# Patient Record
Sex: Female | Born: 1950 | Race: White | Hispanic: No | Marital: Single | Smoking: Never smoker
Health system: Southern US, Community
[De-identification: ages and names within clinical notes are randomized; demographics above are authoritative.]

## PROBLEM LIST (undated history)

## (undated) DIAGNOSIS — F419 Anxiety disorder, unspecified: Secondary | ICD-10-CM

## (undated) DIAGNOSIS — M353 Polymyalgia rheumatica: Secondary | ICD-10-CM

## (undated) DIAGNOSIS — F329 Major depressive disorder, single episode, unspecified: Secondary | ICD-10-CM

## (undated) DIAGNOSIS — S86019A Strain of unspecified Achilles tendon, initial encounter: Secondary | ICD-10-CM

## (undated) DIAGNOSIS — Z9889 Other specified postprocedural states: Secondary | ICD-10-CM

## (undated) DIAGNOSIS — G4730 Sleep apnea, unspecified: Secondary | ICD-10-CM

## (undated) DIAGNOSIS — E039 Hypothyroidism, unspecified: Secondary | ICD-10-CM

## (undated) DIAGNOSIS — F32A Depression, unspecified: Secondary | ICD-10-CM

## (undated) DIAGNOSIS — R112 Nausea with vomiting, unspecified: Secondary | ICD-10-CM

## (undated) HISTORY — PX: DIAGNOSTIC LAPAROSCOPY: SUR761

## (undated) HISTORY — PX: BREAST BIOPSY: SHX20

---
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
## 2003-03-26 HISTORY — PX: ABDOMINAL HYSTERECTOMY: SHX81

## 2004-05-15 ENCOUNTER — Encounter: Payer: Self-pay | Admitting: Pulmonary Disease

## 2005-03-25 HISTORY — PX: CHOLECYSTECTOMY: SHX55

## 2008-10-12 ENCOUNTER — Encounter: Admission: RE | Admit: 2008-10-12 | Discharge: 2008-10-12 | Payer: Self-pay | Admitting: Chiropractic Medicine

## 2008-10-27 ENCOUNTER — Ambulatory Visit: Payer: Self-pay | Admitting: Pulmonary Disease

## 2008-10-27 DIAGNOSIS — G2581 Restless legs syndrome: Secondary | ICD-10-CM | POA: Insufficient documentation

## 2008-10-27 DIAGNOSIS — G4733 Obstructive sleep apnea (adult) (pediatric): Secondary | ICD-10-CM | POA: Insufficient documentation

## 2009-10-17 ENCOUNTER — Telehealth (INDEPENDENT_AMBULATORY_CARE_PROVIDER_SITE_OTHER): Payer: Self-pay | Admitting: *Deleted

## 2009-11-02 ENCOUNTER — Ambulatory Visit: Payer: Self-pay | Admitting: Pulmonary Disease

## 2010-01-01 ENCOUNTER — Ambulatory Visit: Payer: Self-pay | Admitting: Diagnostic Radiology

## 2010-01-01 ENCOUNTER — Emergency Department (HOSPITAL_BASED_OUTPATIENT_CLINIC_OR_DEPARTMENT_OTHER): Admission: EM | Admit: 2010-01-01 | Discharge: 2010-01-01 | Payer: Self-pay | Admitting: Emergency Medicine

## 2010-03-30 ENCOUNTER — Encounter: Payer: Self-pay | Admitting: Pulmonary Disease

## 2010-04-24 NOTE — Progress Notes (Signed)
Summary: talk to nurse  re: requip  Phone Note Call from Patient Call back at Home Phone [phone]   Caller: Patient Call For: clance Reason for Call: Talk to Nurse Summary of Call: Pt states she is currently taking requip 1mg  and it works 50% of the time, wants to know if there is a higher dosage of this med, pls advise.//[facility] [address] Initial call taken by: Darletta Moll,  October 17, 2009 8:21 AM  Follow-up for Phone Call        called and spoke with pt.  pt was last seen by [facility] 10-27-2008.  Pt states she is currently taking Requip 1mg  after supper.  Pt states this dose helps with her RLS approx 50% of the time.  The other times, pt states she is very restless and cannot get her "legs to calm down."  Pt wonders if her dosage needs to be increased.  Will forward message to [facility] to address.  Aundra Millet Reynolds LPN  October 17, 2009 9:06 AM  FYI:  Jonathon Bellows, since it has been almost a year since you last saw her- I'll go ahead and schedule her for a yearly f/u appt with you as well.  Additional Follow-up for Phone Call Additional follow up Details #1::        she does need upcoming ov in the meantime, can increase requip to 1.5mg  after dinner.   can call in 0.5mg  tabs to take with the 1mg  tabs.  #20, no fills until we can figure out if will help.  If it doesn't may have to check iron levels or try different med. Additional Follow-up by: Barbaraann Share MD,  October 17, 2009 1:06 PM    Additional Follow-up for Phone Call Additional follow up Details #2::    called and spoke with pt.  pt aware of KC's recs.  Pt scheduled f/u appt with [facility] for 11-02-2009 at 3:45pm.   Pt aware rx for 0.5 mg sent to pharmacy.  pt also states she is almost out of the 1mg  and requests that be called in as well.  rx sent to pharmacy.  Aundra Millet Reynolds LPN  October 17, 2009 1:20 PM   New/Updated Medications: REQUIP 0.5 MG TABS (ROPINIROLE HCL) take 1 tab by mouth once daily after dinner along with a 1mg  tab as well. Prescriptions: REQUIP  1 MG  TABS (ROPINIROLE HCL) one after dinner.  #20 x 0   Entered by:   Arman Filter LPN   Authorized by:   Barbaraann Share MD   Signed by:   Arman Filter LPN on 09/81/1914   Method used:   Elect[facility] to        [phone] (retail[facility]       [address]       [place], [place]  [address]       Ph: [phone] or [phone]       Fax: [phone]   RxID:   [id number] REQUIP 0.5 MG TABS (ROPINIROLE HCL) take 1 tab by mouth once daily after dinner along with a 1mg  tab as well.  #20 x 0   Entered by:   Arman Filter LPN   Authorized by:   Barbaraann Share MD   Signed by:   Arman Filter LPN on 56/43/3295   Method used:   Electronically to        [phone][facility] (retail)       [address]  [place], [place]  [address]       Ph: [phone] or [phone]       Fax: [phone]   RxID:   [id number]

## 2010-04-24 NOTE — Assessment & Plan Note (Signed)
Summary: rov for osa, rls   CC:  Pt is here for a 1 yr f/u appt on her OSA and RLS.  Pt states she hasn't noticed a difference in her RLS since increasing dose of Requip to 1.5mg .  Pt states she is wearing her cpap machine every night.  Approx 6 to 7 hours per night. Pt states she is waking up 2 times every night and is unsure why this is happening.  Marland Kitchen  History of Present Illness: the pt comes in today for f/u of her known osa and RLS.  She is wearing cpap compliantly, and reports no issues with her mask or pressure.  Despite this, the pt has been having awakenings during the night for unknown reasons.  She has a h/o RLS, and is having some breakthru symptoms despite increasing her dose to 1.5mg  at hs.  Current Medications (verified): 1)  Prozac 20 Mg Caps (Fluoxetine Hcl) .... Once Daily 2)  Synthroid 50 Mcg Tabs (Levothyroxine Sodium) .... Once Daily 3)  Prednisone 5 Mg Tabs (Prednisone) .... Take 2 Tabs By Mouth Daily 4)  Requip 1 Mg  Tabs (Ropinirole Hcl) .... One After Dinner. 5)  Requip 0.5 Mg Tabs (Ropinirole Hcl) .... Take 1 Tab By Mouth Once Daily After Dinner Along With A 1mg  Tab As Well. 6)  Methotrexate 20mg  .... Weekly 7)  Folic Acid 1 Mg Tabs (Folic Acid) .... Take 1 Tablet By Mouth Two Times A Day 8)  Hydrochlorothiazide 25 Mg Tabs (Hydrochlorothiazide) .... Take 1 Tablet By Mouth Once A Day 9)  Vitamin D 2000 Units .... Take 1 Tablet By Mouth Once A Day 10)  Calcium 1200mg  .... Take 1 Tablet By Mouth Once A Day  Allergies (verified): 1)  ! Pcn 2)  ! Codeine  Review of Systems       The patient complains of productive cough, acid heartburn, indigestion, weight change, and hand/feet swelling.  The patient denies shortness of breath with activity, shortness of breath at rest, non-productive cough, coughing up blood, chest pain, irregular heartbeats, loss of appetite, abdominal pain, difficulty swallowing, sore throat, tooth/dental problems, headaches, nasal  congestion/difficulty breathing through nose, sneezing, itching, ear ache, anxiety, depression, joint stiffness or pain, rash, change in color of mucus, and fever.    Vital Signs:  Patient profile:   60 year old female Height:      66 inches Weight:      254.38 pounds BMI:     41.21 O2 Sat:      95 % on Room air Temp:     98.2 degrees F oral Pulse rate:   81 / minute BP sitting:   102 / 76  (left arm) Cuff size:   large  Vitals Entered By: Arman Filter LPN (November 02, 2009 4:02 PM)  O2 Flow:  Room air CC: Pt is here for a 1 yr f/u appt on her OSA and RLS.  Pt states she hasn't noticed a difference in her RLS since increasing dose of Requip to 1.5mg .  Pt states she is wearing her cpap machine every night.  Approx 6 to 7 hours per night. Pt states she is waking up 2 times every night and is unsure why this is happening.   Comments Medications reviewed with patient Arman Filter LPN  November 02, 2009 4:03 PM    Physical Exam  General:  obese female in nad Nose:  no skin breakdown or pressure necrosis from cpap mask Extremities:  mild edema, but no cyanosis  Neurologic:  alert, not sleepy, moves all 4.   Impression & Recommendations:  Problem # 1:  OBSTRUCTIVE SLEEP APNEA (ICD-327.23)  the pt seems to be doing well with cpap.  I have asked her to keep up with mask changes and supplies, and to work aggressively on weight loss.  Problem # 2:  RESTLESS LEGS SYNDROME (ICD-333.94)  the pt is having some breakthru symptoms despite taking requip.  She is taking all of the 1.5mg  dose after dinner, and perhaps this doesn't get her thru the night.  I have asked her to try splitting her dose, with the majority being taken at bedtime.  I also reminded her that chronic pain can mimick RLS symptoms, and that other environmental factors ( light, temp, mattress/pillow, bedpartner, pets) can result in arousal from sleep.  Medications Added to Medication List This Visit: 1)  Prednisone 5 Mg Tabs  (Prednisone) .... Take 2 tabs by mouth daily 2)  Methotrexate 20mg   .... Weekly 3)  Folic Acid 1 Mg Tabs (Folic acid) .... Take 1 tablet by mouth two times a day 4)  Hydrochlorothiazide 25 Mg Tabs (Hydrochlorothiazide) .... Take 1 tablet by mouth once a day 5)  Vitamin D 2000 Units  .... Take 1 tablet by mouth once a day 6)  Calcium 1200mg   .... Take 1 tablet by mouth once a day  Other Orders: Est. Patient Level III ([address])  Patient Instructions: 1)  continue on cpap  2)  work on weight loss and exercise 3)  try taking 0.5mg  of requip at dinner time, then the 1mg  about 30-62min before going to bed.  Let me know if not helping.  Keep in mind that chronic pain can also mimick RLS. 4)  followup with me in one year.

## 2010-04-26 NOTE — Medication Information (Signed)
Summary: Order for CPAP Supplies / [employer] for [employer] [facility] [employer]   Imported By: Lennie Odor 04/10/2010 11:28:03  _____________________________________________________________________  External Attachment:    Type:   Image     Comment:   External Document

## 2010-06-07 LAB — DIFFERENTIAL
Basophils Absolute: 0.1 10*3/uL (ref 0.0–0.1)
Basophils Relative: 1 % (ref 0–1)
Eosinophils Absolute: 0.1 10*3/uL (ref 0.0–0.7)
Eosinophils Relative: 1 % (ref 0–5)
Lymphocytes Relative: 21 % (ref 12–46)
Lymphs Abs: 1.4 10*3/uL (ref 0.7–4.0)
Monocytes Absolute: 0.5 10*3/uL (ref 0.1–1.0)
Monocytes Relative: 7 % (ref 3–12)
Neutro Abs: 4.6 10*3/uL (ref 1.7–7.7)
Neutrophils Relative %: 70 % (ref 43–77)

## 2010-06-07 LAB — BASIC METABOLIC PANEL
BUN: 13 mg/dL (ref 6–23)
CO2: 28 mEq/L (ref 19–32)
Calcium: 8.6 mg/dL (ref 8.4–10.5)
Chloride: 106 mEq/L (ref 96–112)
Creatinine, Ser: 0.8 mg/dL (ref 0.4–1.2)
GFR calc Af Amer: >60 mL/min (ref >60)
GFR calc non Af Amer: >60 mL/min (ref >60)
Glucose, Bld: 90 mg/dL (ref 70–99)
Potassium: 3.2 mEq/L — ABNORMAL LOW (ref 3.5–5.1)
Sodium: 140 mEq/L (ref 135–145)

## 2010-06-07 LAB — URINE CULTURE
Colony Count: 15000
Culture  Setup Time: 201110101931

## 2010-06-07 LAB — URINALYSIS, ROUTINE W REFLEX MICROSCOPIC
Bilirubin Urine: NEGATIVE
Glucose, UA: NEGATIVE mg/dL
Hgb urine dipstick: NEGATIVE
Ketones, ur: NEGATIVE mg/dL
Nitrite: NEGATIVE
Protein, ur: NEGATIVE mg/dL
Specific Gravity, Urine: 1.008 (ref 1.005–1.030)
Urobilinogen, UA: 1 mg/dL (ref 0.0–1.0)
pH: 7.5 (ref 5.0–8.0)

## 2010-06-07 LAB — CBC
HCT: 34.9 % — ABNORMAL LOW (ref 36.0–46.0)
Hemoglobin: 11.8 g/dL — ABNORMAL LOW (ref 12.0–15.0)
MCH: 30 pg (ref 26.0–34.0)
MCHC: 33.9 g/dL (ref 30.0–36.0)
MCV: 88.6 fL (ref 78.0–100.0)
Platelets: 228 10*3/uL (ref 150–400)
RBC: 3.94 MIL/uL (ref 3.87–5.11)
RDW: 14 % (ref 11.5–15.5)
WBC: 6.7 10*3/uL (ref 4.0–10.5)

## 2010-06-07 LAB — POCT CARDIAC MARKERS
CKMB, poc: <1 ng/mL — ABNORMAL LOW (ref 1.0–8.0)
Myoglobin, poc: 28.8 ng/mL (ref 12–200)
Troponin i, poc: <0.05 ng/mL (ref 0.00–0.09)

## 2010-07-16 ENCOUNTER — Other Ambulatory Visit: Payer: Self-pay | Admitting: Pulmonary Disease

## 2010-08-01 ENCOUNTER — Ambulatory Visit (HOSPITAL_COMMUNITY)
Admission: RE | Admit: 2010-08-01 | Discharge: 2010-08-01 | Disposition: A | Discharge status: Home / Self Care | Payer: BC Managed Care – PPO | Source: Ambulatory Visit | Attending: Interventional Radiology | Admitting: Interventional Radiology

## 2010-08-01 DIAGNOSIS — M79609 Pain in unspecified limb: Secondary | ICD-10-CM | POA: Insufficient documentation

## 2010-08-01 DIAGNOSIS — M7989 Other specified soft tissue disorders: Secondary | ICD-10-CM | POA: Insufficient documentation

## 2010-11-27 ENCOUNTER — Other Ambulatory Visit: Payer: Self-pay | Admitting: Pulmonary Disease

## 2012-05-11 ENCOUNTER — Other Ambulatory Visit: Payer: Self-pay | Admitting: Otolaryngology

## 2012-05-11 DIAGNOSIS — R600 Localized edema: Secondary | ICD-10-CM

## 2012-05-11 DIAGNOSIS — R221 Localized swelling, mass and lump, neck: Secondary | ICD-10-CM

## 2012-05-11 DIAGNOSIS — K1120 Sialoadenitis, unspecified: Secondary | ICD-10-CM

## 2012-05-12 ENCOUNTER — Ambulatory Visit
Admission: RE | Admit: 2012-05-12 | Discharge: 2012-05-12 | Disposition: A | Discharge status: Home / Self Care | Payer: 59 | Source: Ambulatory Visit | Attending: Otolaryngology | Admitting: Otolaryngology

## 2012-05-12 DIAGNOSIS — R600 Localized edema: Secondary | ICD-10-CM

## 2012-05-12 DIAGNOSIS — R221 Localized swelling, mass and lump, neck: Secondary | ICD-10-CM

## 2012-05-12 DIAGNOSIS — K1120 Sialoadenitis, unspecified: Secondary | ICD-10-CM

## 2012-05-12 MED ORDER — IOHEXOL 300 MG/ML  SOLN
75.0000 mL | Freq: Once | INTRAMUSCULAR | Status: AC | PRN
  Administered 2012-05-12: 75 mL via INTRAVENOUS

## 2012-07-01 ENCOUNTER — Encounter (HOSPITAL_COMMUNITY): Payer: Self-pay | Admitting: *Deleted

## 2012-07-06 ENCOUNTER — Ambulatory Visit (HOSPITAL_COMMUNITY): Admission: RE | Admit: 2012-07-06 | Payer: 59 | Source: Ambulatory Visit | Admitting: Obstetrics & Gynecology

## 2012-07-06 HISTORY — DX: Other specified postprocedural states: R11.2

## 2012-07-06 HISTORY — DX: Other specified postprocedural states: Z98.890

## 2012-07-06 HISTORY — DX: Sleep apnea, unspecified: G47.30

## 2012-07-06 HISTORY — DX: Hypothyroidism, unspecified: E03.9

## 2012-07-06 SURGERY — MARSUPIALIZATION, CYST, BARTHOLIN'S GLAND
Anesthesia: Choice | Laterality: Right

## 2012-07-29 DIAGNOSIS — K1120 Sialoadenitis, unspecified: Secondary | ICD-10-CM | POA: Insufficient documentation

## 2014-05-17 ENCOUNTER — Encounter: Payer: Self-pay | Admitting: Podiatry

## 2014-05-17 ENCOUNTER — Ambulatory Visit (INDEPENDENT_AMBULATORY_CARE_PROVIDER_SITE_OTHER): Payer: BLUE CROSS/BLUE SHIELD

## 2014-05-17 ENCOUNTER — Ambulatory Visit (INDEPENDENT_AMBULATORY_CARE_PROVIDER_SITE_OTHER): Payer: BLUE CROSS/BLUE SHIELD | Admitting: Podiatry

## 2014-05-17 ENCOUNTER — Encounter: Payer: Self-pay | Admitting: *Deleted

## 2014-05-17 VITALS — BP 130/74 | HR 84 | Resp 16 | Ht 66.0 in | Wt 260.0 lb

## 2014-05-17 DIAGNOSIS — E039 Hypothyroidism, unspecified: Secondary | ICD-10-CM | POA: Insufficient documentation

## 2014-05-17 DIAGNOSIS — M1990 Unspecified osteoarthritis, unspecified site: Secondary | ICD-10-CM | POA: Insufficient documentation

## 2014-05-17 DIAGNOSIS — M353 Polymyalgia rheumatica: Secondary | ICD-10-CM | POA: Insufficient documentation

## 2014-05-17 DIAGNOSIS — M779 Enthesopathy, unspecified: Secondary | ICD-10-CM

## 2014-05-17 DIAGNOSIS — M722 Plantar fascial fibromatosis: Secondary | ICD-10-CM

## 2014-05-17 DIAGNOSIS — G2581 Restless legs syndrome: Secondary | ICD-10-CM | POA: Insufficient documentation

## 2014-05-17 DIAGNOSIS — M7662 Achilles tendinitis, left leg: Secondary | ICD-10-CM

## 2014-05-17 MED ORDER — METHYLPREDNISOLONE (PAK) 4 MG PO TABS: ORAL_TABLET | ORAL | Status: DC

## 2014-05-17 NOTE — Progress Notes (Signed)
   Subjective:    Patient ID: Kathy Fernandez, female    DOB: Jul 02, 1950, 64 y.o.   MRN: [id number]  HPI Comments: Left back of the heel has started to hurt about 6 weeks ago , it has got worse. It is swelling and pain along the lateral side of ankle   Foot Pain Associated symptoms include coughing and numbness.      Review of Systems  Respiratory: Positive for cough.   Musculoskeletal: Positive for back pain.  Neurological: Positive for numbness.  All other systems reviewed and are negative.      Objective:   Physical Exam: I have reviewed her past medical history medications allergies surgery social history and review of systems. Pulses are strongly palpable bilateral. Neurologic sensorium is intact per Semmes-Weinstein monofilament. Deep tendon reflexes are intact bilateral and muscle strength was 5 over 5 dorsiflexors plantar flexors and inverters and everters all intrinsic musculature is intact. Orthopedic evaluation demonstrates all joints distal to the ankle for range of motion without crepitation. She has pain on palpation of the Achilles tendon on the posterior aspect of the left heel with warmth edema and erythema to the touch. Majority of the pain is centrally located in the posterior aspect of the calcaneus. The right posterior heel and Achilles also demonstrates what appears to have been a previous tear with thickening of the tendo Achilles that is nontender at this time. Radiographic evaluation of the left foot does demonstrate a very large retrocalcaneal heel spur with thickening of the tendo Achilles as it inserts.        Assessment & Plan:  Assessment: Insertional Achilles tendinitis left with retrocalcaneal heel spur. Can't rule out a split tear.  Plan: We discussed the etiology pathology conservative versus surgical therapies. We discussed appropriate shoe gear stretching exercises ice therapy and shoe gear modifications. We wrote a prescription for Medrol Dosepak to be  followed by her naproxen. I also injected dexamethasone 2 mg to the point of maximal tenderness subcutaneously and not into the tendon. Also placed her in a Cam Walker for immobilization. I will follow-up with her in 3-4 weeks at which time if she is no better and MRI will be ordered. If she has improved then physical therapy will be ordered.  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

## 2014-05-24 ENCOUNTER — Other Ambulatory Visit: Payer: Self-pay

## 2014-05-24 DIAGNOSIS — Z1231 Encounter for screening mammogram for malignant neoplasm of breast: Secondary | ICD-10-CM

## 2014-06-28 ENCOUNTER — Ambulatory Visit: Payer: BLUE CROSS/BLUE SHIELD | Admitting: Podiatry

## 2014-06-29 ENCOUNTER — Ambulatory Visit: Payer: Self-pay

## 2014-07-05 ENCOUNTER — Ambulatory Visit
Admission: RE | Admit: 2014-07-05 | Discharge: 2014-07-05 | Disposition: A | Discharge status: Home / Self Care | Payer: BLUE CROSS/BLUE SHIELD | Source: Ambulatory Visit

## 2014-07-05 DIAGNOSIS — Z1231 Encounter for screening mammogram for malignant neoplasm of breast: Secondary | ICD-10-CM

## 2014-07-07 ENCOUNTER — Encounter: Payer: Self-pay | Admitting: Podiatry

## 2014-07-07 ENCOUNTER — Ambulatory Visit (INDEPENDENT_AMBULATORY_CARE_PROVIDER_SITE_OTHER): Payer: BLUE CROSS/BLUE SHIELD | Admitting: Podiatry

## 2014-07-07 VITALS — BP 116/62 | HR 71 | Resp 16

## 2014-07-07 DIAGNOSIS — M7662 Achilles tendinitis, left leg: Secondary | ICD-10-CM | POA: Diagnosis not present

## 2014-07-07 DIAGNOSIS — S86012D Strain of left Achilles tendon, subsequent encounter: Secondary | ICD-10-CM | POA: Diagnosis not present

## 2014-07-07 NOTE — Progress Notes (Signed)
She presents today for follow-up of her Achilles tendinitis of long duration left heel. She has been diligent about wearing the Cam Walker however at this point she states that it just does not seem to be improving.  Objective: Vital signs are stable she's alert and oriented 3 no calf pain. Mild swelling in the left lower extremity pulses are palpable left. She has tenderness on palpation of the Achilles tendon left.  Assessment: Probable split tear of the tendo Achilles with a retrocalcaneal heel spur present.  Plan: At this point I feel an MRI is necessary to evaluate the integrity of the tendon for surgical consideration. I will follow up with her once her MRI has been read.

## 2014-07-08 ENCOUNTER — Telehealth: Payer: Self-pay | Admitting: *Deleted

## 2014-07-08 NOTE — Telephone Encounter (Signed)
Pt is scheduled for MRI on 07/12/2014 and needs prior authorization.

## 2014-07-08 NOTE — Telephone Encounter (Signed)
Pt is schedule for MRI on 07/12/2014, needs prior authorization.

## 2014-07-11 NOTE — Telephone Encounter (Signed)
I called and left Kathy Fernandez a message that MRI was authorized.  Authorization number is [id number], approved from 07/11/2014 - 08/09/2014.  I called patient and left a message that MRI was authorized.  I left a message for Kathy Fernandez at [facility].  "I've been waiting for 10 minutes to speak to you.  I've talked to 3 different people and was told they couldn't reach you."  I just called and left you a message that MRI was authorized.  "When did you call me?"  Probably while they were trying to contact me for you.  "Well I didn't know, thank you."

## 2014-07-12 ENCOUNTER — Ambulatory Visit
Admission: RE | Admit: 2014-07-12 | Discharge: 2014-07-12 | Disposition: A | Discharge status: Home / Self Care | Payer: BLUE CROSS/BLUE SHIELD | Source: Ambulatory Visit | Attending: Podiatry | Admitting: Podiatry

## 2014-07-12 DIAGNOSIS — M7662 Achilles tendinitis, left leg: Secondary | ICD-10-CM

## 2014-07-14 ENCOUNTER — Telehealth: Payer: Self-pay | Admitting: *Deleted

## 2014-07-14 NOTE — Telephone Encounter (Signed)
Achilles tendinosis with out tear but increased fluid signal within the tendon.

## 2014-07-14 NOTE — Telephone Encounter (Addendum)
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  I left a message to call again for results.  I called pt and read Dr. Geryl RankinsHyatt's statement to pt.

## 2014-07-14 NOTE — Telephone Encounter (Signed)
I called and left patient a message that Dr. Al CorpusHyatt got results.  He wants to have it re-read by another physician.  We wanted to make you aware of delay.  Please call if you have any questions or concerns.  MRI disk was [facility] to [facility] to be re-read by Dr. Stephani Policeallas Smith.

## 2014-07-20 ENCOUNTER — Encounter: Payer: Self-pay | Admitting: Podiatry

## 2014-07-21 ENCOUNTER — Ambulatory Visit: Payer: BLUE CROSS/BLUE SHIELD | Admitting: Podiatry

## 2014-07-26 ENCOUNTER — Encounter: Payer: Self-pay | Admitting: Podiatry

## 2014-07-26 ENCOUNTER — Ambulatory Visit (INDEPENDENT_AMBULATORY_CARE_PROVIDER_SITE_OTHER): Payer: BLUE CROSS/BLUE SHIELD | Admitting: Podiatry

## 2014-07-26 VITALS — BP 129/72 | HR 71 | Resp 16

## 2014-07-26 DIAGNOSIS — S86012D Strain of left Achilles tendon, subsequent encounter: Secondary | ICD-10-CM | POA: Diagnosis not present

## 2014-07-26 DIAGNOSIS — M7732 Calcaneal spur, left foot: Secondary | ICD-10-CM | POA: Diagnosis not present

## 2014-07-26 NOTE — Patient Instructions (Signed)
Pre-Operative Instructions  Congratulations, you have decided to take an important step to improving your quality of life.  You can be assured that the doctors of [facility] will be with you every step of the way.  1. Plan to be at the surgery center/hospital at least 1 (one) hour prior to your scheduled time unless otherwise directed by the surgical center/hospital staff.  You must have a responsible adult accompany you, remain during the surgery and drive you home.  Make sure you have directions to the surgical center/hospital and know how to get there on time. 2. For hospital based surgery you will need to obtain a history and physical form from your family physician within 1 month prior to the date of surgery- we will give you a form for you primary physician.  3. We make every effort to accommodate the date you request for surgery.  There are however, times where surgery dates or times have to be moved.  We will contact you as soon as possible if a change in schedule is required.   4. No Aspirin/Ibuprofen for one week before surgery.  If you are on aspirin, any non-steroidal anti-inflammatory medications (Mobic, Aleve, Ibuprofen) you should stop taking it 7 days prior to your surgery.  You make take Tylenol  For pain prior to surgery.  5. Medications- If you are taking daily heart and blood pressure medications, seizure, reflux, allergy, asthma, anxiety, pain or diabetes medications, make sure the surgery center/hospital is aware before the day of surgery so they may notify you which medications to take or avoid the day of surgery. 6. No food or drink after midnight the night before surgery unless directed otherwise by surgical center/hospital staff. 7. No alcoholic beverages 24 hours prior to surgery.  No smoking 24 hours prior to or 24 hours after surgery. 8. Wear loose pants or shorts- loose enough to fit over bandages, boots, and casts. 9. No slip on shoes, sneakers are best. 10. Bring  your boot with you to the surgery center/hospital.  Also bring crutches or a walker if your physician has prescribed it for you.  If you do not have this equipment, it will be provided for you after surgery. 11. If you have not been contracted by the surgery center/hospital by the day before your surgery, call to confirm the date and time of your surgery. 12. Leave-time from work may vary depending on the type of surgery you have.  Appropriate arrangements should be made prior to surgery with your employer. 13. Prescriptions will be provided immediately following surgery by your doctor.  Have these filled as soon as possible after surgery and take the medication as directed. 14. Remove nail polish on the operative foot. 15. Wash the night before surgery.  The night before surgery wash the foot and leg well with the antibacterial soap provided and water paying special attention to beneath the toenails and in between the toes.  Rinse thoroughly with water and dry well with a towel.  Perform this wash unless told not to do so by your physician.  Enclosed: 1 Ice pack (please put in freezer the night before surgery)   1 Hibiclens skin cleaner   Pre-op Instructions  If you have any questions regarding the instructions, do not hesitate to call our office.  [place]: [address] [place], [place] [address] [phone]  [place]: [address] [place], [place] [address] [phone]  [place]: [address]  [place], [place] [address] [phone]  Dr. Richard   Tuchman DPM, Dr. Norman Regal DPM Dr. Richard Sikora DPM, Dr. M. Todd Hyatt DPM, Dr. Kathryn Egerton DPM 

## 2014-07-26 NOTE — Progress Notes (Signed)
She presents today for her MRI results. She states that her left Achilles is still painful as it was and has not improved even wearing the Cam Walker.  Objective: Vital signs are stable she is alert and oriented 3. Pulses are palpable left. No swelling in the calf. She does not have gastroc equinus. She has pain on palpation of the tendo Achilles as it inserts on the posterior aspect of the calcaneus. Mild erythema and mild edema are associated with this. Radiographic evaluation was reviewed does demonstrate a retrocalcaneal heel spur. MRI evaluation reviewed does demonstrate a soft tissue sagittal split tear of the tendo Achilles at its insertion on the calcaneus.  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  Plan: Discussed etiology pathology conservative versus surgical therapies. At this point we discussed surgical intervention consisting of a retrocalcaneal heel spur resection and an Achilles tendon lysis. She understands that she will be cast bound and nonweightbearing bearing for a period of 6-8 weeks. We went over the consent form regarding these procedures. I answered all the questions regarding these procedures to the best of my ability in layman's terms. She understood it was amenable to it and find all 3 cages of the consent form. We discussed possible postop complications which may include but are not limited to postop pain bleeding swelling infection recurrence and need for further surgery overcorrection and under correction also digit loss of limb and loss of life. I will follow-up with her in the near future for surgical intervention.

## 2014-08-04 ENCOUNTER — Emergency Department (HOSPITAL_BASED_OUTPATIENT_CLINIC_OR_DEPARTMENT_OTHER)
Admission: EM | Admit: 2014-08-04 | Discharge: 2014-08-04 | Disposition: A | Discharge status: Home / Self Care | Payer: BLUE CROSS/BLUE SHIELD | Attending: Emergency Medicine | Admitting: Emergency Medicine

## 2014-08-04 ENCOUNTER — Emergency Department (HOSPITAL_BASED_OUTPATIENT_CLINIC_OR_DEPARTMENT_OTHER): Payer: BLUE CROSS/BLUE SHIELD

## 2014-08-04 ENCOUNTER — Encounter (HOSPITAL_BASED_OUTPATIENT_CLINIC_OR_DEPARTMENT_OTHER): Payer: Self-pay

## 2014-08-04 DIAGNOSIS — Z880 Allergy status to penicillin: Secondary | ICD-10-CM | POA: Insufficient documentation

## 2014-08-04 DIAGNOSIS — Z791 Long term (current) use of non-steroidal anti-inflammatories (NSAID): Secondary | ICD-10-CM | POA: Diagnosis not present

## 2014-08-04 DIAGNOSIS — E039 Hypothyroidism, unspecified: Secondary | ICD-10-CM | POA: Diagnosis not present

## 2014-08-04 DIAGNOSIS — Z8669 Personal history of other diseases of the nervous system and sense organs: Secondary | ICD-10-CM | POA: Diagnosis not present

## 2014-08-04 DIAGNOSIS — R2242 Localized swelling, mass and lump, left lower limb: Secondary | ICD-10-CM | POA: Diagnosis present

## 2014-08-04 DIAGNOSIS — X58XXXS Exposure to other specified factors, sequela: Secondary | ICD-10-CM | POA: Diagnosis not present

## 2014-08-04 DIAGNOSIS — Z8739 Personal history of other diseases of the musculoskeletal system and connective tissue: Secondary | ICD-10-CM | POA: Diagnosis not present

## 2014-08-04 DIAGNOSIS — S86002S Unspecified injury of left Achilles tendon, sequela: Secondary | ICD-10-CM | POA: Insufficient documentation

## 2014-08-04 DIAGNOSIS — Z79899 Other long term (current) drug therapy: Secondary | ICD-10-CM | POA: Insufficient documentation

## 2014-08-04 DIAGNOSIS — R52 Pain, unspecified: Secondary | ICD-10-CM

## 2014-08-04 HISTORY — DX: Strain of unspecified achilles tendon, initial encounter: S86.019A

## 2014-08-04 HISTORY — DX: Polymyalgia rheumatica: M35.3

## 2014-08-04 NOTE — Discharge Instructions (Signed)
Rest, Ice intermittently (in the first 24-48 hours), Gentle compression with an Ace wrap, and elevate (Limb above the level of the heart) °  °Please follow with your primary care doctor in the next 2 days for a check-up. They must obtain records for further management.  ° °Do not hesitate to return to the Emergency Department for any new, worsening or concerning symptoms.  ° ° ° °

## 2014-08-04 NOTE — ED Notes (Signed)
Swelling to left foot/ankle/pain to calf-started yesterday-denies injury yesterday-hx of Achilles tear

## 2014-08-04 NOTE — ED Provider Notes (Signed)
CSN: [id number]     Arrival date & time 08/04/14  1634 History   First MD Initiated Contact with Patient 08/04/14 1709     Chief Complaint  Patient presents with  . Leg Swelling     (Consider location/radiation/quality/duration/timing/severity/associated sxs/prior Treatment) HPI   Driscilla MoatsSandra Georg is a 64 y.o. female complaining of pain and swelling to left foot ankle and calf. Patient has a partial Achilles tendon tear which is being managed by local podiatrist, diagnosed in the last several weeks, she's had swelling to this area with pain at dorsi flexion however she states that the swelling is new and normally response to elevation but this time is not. She is concerned about a DVT. She denies any prior history of DVT or PE, chest pain, shortness of breath, cough. She states that she's been up and on her feet is normal with no prolonged  Immobilization. States the pain is also increasing, rated at 5 out of 10 but she declines pain medication.  Past Medical History  Diagnosis Date  . PONV (postoperative nausea and vomiting)   . Sleep apnea   . Hypothyroidism   . Achilles tendon tear   . Polymyalgia rheumatica    Past Surgical History  Procedure Laterality Date  . Abdominal hysterectomy  2005  . Cholecystectomy  2007  . Diagnostic laparoscopy     No family history on file. History  Substance Use Topics  . Smoking status: Never Smoker   . Smokeless tobacco: Not on file  . Alcohol Use: No   OB History    No data available     Review of Systems  10 systems reviewed and found to be negative, except as noted in the HPI.   Allergies  Neomycin-bacitracin zn-polymyx; Codeine; and Penicillins  Home Medications   Prior to Admission medications   Medication Sig Start Date End Date Taking? Authorizing Provider  FLUoxetine (PROZAC) 20 MG capsule Take 20 mg by mouth daily.    Historical Provider, MD  levothyroxine (SYNTHROID, LEVOTHROID) 50 MCG tablet Take 50 mcg by mouth daily  before breakfast.    Historical Provider, MD  naproxen sodium (ANAPROX) 220 MG tablet Take 220 mg by mouth 2 (two) times daily with a meal.    Historical Provider, MD  omeprazole (PRILOSEC) 40 MG capsule Take 40 mg by mouth daily.    Historical Provider, MD  pramipexole (MIRAPEX) 0.5 MG tablet Take 0.5 mg by mouth 3 (three) times daily.    Historical Provider, MD   BP 128/78 mmHg  Pulse 82  Temp(Src) 98.1 F (36.7 C) (Oral)  Resp 18  Ht 5\' 7"  (1.702 m)  Wt 260 lb (117.935 kg)  BMI 40.71 kg/m2  SpO2 99% Physical Exam  Constitutional: She is oriented to person, place, and time. She appears well-developed and well-nourished. No distress.  HENT:  Head: Normocephalic.  Mouth/Throat: Oropharynx is clear and moist.  Eyes: Conjunctivae and EOM are normal.  Cardiovascular: Normal rate, regular rhythm and intact distal pulses.   Pulmonary/Chest: Effort normal and breath sounds normal. No stridor. No respiratory distress. She has no wheezes. She has no rales.  Abdominal: Soft. Bowel sounds are normal.  Musculoskeletal: Normal range of motion. She exhibits edema.  1+ pitting edema to left lower extremity, no significant calf asymmetry, lateral calf with firm region: question palpable cord. Homans sign is negative.  Neurological: She is alert and oriented to person, place, and time.  Psychiatric: She has a normal mood and affect.  Nursing note and  vitals reviewed.   ED Course  Procedures (including critical care time) Labs Review Labs Reviewed - No data to display  Imaging Review No results found.   EKG Interpretation None      MDM   Final diagnoses:  Pain  Injury of left Achilles tendon, sequela    Filed Vitals:   08/04/14 1638  BP: 128/78  Pulse: 82  Temp: 98.1 F (36.7 C)  TempSrc: Oral  Resp: 18  Height: 5\' 7"  (1.702 m)  Weight: 260 lb (117.935 kg)  SpO2: 99%    Driscilla MoatsSandra Suess is a pleasant 64 y.o. female presenting with increasing swelling and pain to left lower  extremity. Patient has Achilles tendon partial tear on the left side however states that the swelling is more severe and nonresponsive to elevation. No signs of PE. Will obtain Doppler rule out DVT. Patient declines any pain medication.  Ultrasound with no DVT. Advised patient to follow closely with primary care. Patient verbalized understanding.  Evaluation does not show pathology that would require ongoing emergent intervention or inpatient treatment. Pt is hemodynamically stable and mentating appropriately. Discussed findings and plan with patient/guardian, who agrees with care plan. All questions answered. Return precautions discussed and outpatient follow up given.        Wynetta Emeryicole Tramaine Sauls, PA-C 08/04/14 1938  Layla MawKristen N Ward, DO 08/04/14 1955

## 2014-08-23 DIAGNOSIS — B351 Tinea unguium: Secondary | ICD-10-CM

## 2014-09-22 ENCOUNTER — Other Ambulatory Visit: Payer: Self-pay | Admitting: Podiatry

## 2014-09-22 MED ORDER — MEPERIDINE HCL 50 MG PO TABS: ORAL_TABLET | ORAL | Status: DC

## 2014-09-22 MED ORDER — PROMETHAZINE HCL 25 MG PO TABS: 25.0000 mg | ORAL_TABLET | Freq: Three times a day (TID) | ORAL | Status: DC | PRN

## 2014-09-22 MED ORDER — SULFAMETHOXAZOLE-TRIMETHOPRIM 800-160 MG PO TABS: 1.0000 | ORAL_TABLET | Freq: Two times a day (BID) | ORAL | Status: DC

## 2014-09-23 ENCOUNTER — Encounter: Payer: Self-pay | Admitting: Podiatry

## 2014-09-23 DIAGNOSIS — M7732 Calcaneal spur, left foot: Secondary | ICD-10-CM | POA: Diagnosis not present

## 2014-09-23 DIAGNOSIS — M2042 Other hammer toe(s) (acquired), left foot: Secondary | ICD-10-CM | POA: Diagnosis not present

## 2014-09-27 ENCOUNTER — Encounter: Payer: Self-pay | Admitting: Podiatry

## 2014-09-27 ENCOUNTER — Ambulatory Visit (INDEPENDENT_AMBULATORY_CARE_PROVIDER_SITE_OTHER): Payer: BLUE CROSS/BLUE SHIELD | Admitting: Podiatry

## 2014-09-27 ENCOUNTER — Telehealth: Payer: Self-pay | Admitting: *Deleted

## 2014-09-27 VITALS — BP 119/72 | HR 83 | Temp 98.3°F | Resp 18

## 2014-09-27 DIAGNOSIS — Z9889 Other specified postprocedural states: Secondary | ICD-10-CM

## 2014-09-27 NOTE — Telephone Encounter (Signed)
Pt states she is having a lot of pressure to the lateral left heel and leg bone, becomes purple when dangled but okay otherwise.  Pt denies visible edema or redness,states she would not have to take pain medication if it were not for the extreme pressure to the lateral heel and leg.  I asked pt if the area over the painful area was hot and she said yes.  I informed Dr. Al CorpusHyatt and assistant and pt is to be seen today at 330pm.

## 2014-09-28 NOTE — Progress Notes (Signed)
Kathy DavenportSandra presents today for follow-up of her surgical foot right she is status post retrocalcaneal heel spur with Achilles tendon lysis right. She had a cast applied and she is concerned that there is irritation within the cast. She denies fever chills nausea vomiting muscle aches and pains.  Objective: Vital signs are stable she is alert and oriented 3. Pulses are palpable behind the knee right. She has no thigh pain and she has good sensation and range of motion to the tips of the toes. The cast does not appear to be tight that we will bivalve it anyway.  Assessment: Status post less than 1 week retrocalcaneal spur right.  Plan: Bivalve the cast today and will follow-up with her in 1 week for cast removal and a new application.

## 2014-09-29 ENCOUNTER — Encounter: Payer: BLUE CROSS/BLUE SHIELD | Admitting: Podiatry

## 2014-10-07 ENCOUNTER — Ambulatory Visit (INDEPENDENT_AMBULATORY_CARE_PROVIDER_SITE_OTHER): Payer: BLUE CROSS/BLUE SHIELD | Admitting: Podiatry

## 2014-10-07 ENCOUNTER — Ambulatory Visit: Payer: BLUE CROSS/BLUE SHIELD

## 2014-10-07 ENCOUNTER — Encounter: Payer: Self-pay | Admitting: Podiatry

## 2014-10-07 VITALS — BP 124/68 | HR 76 | Resp 12

## 2014-10-07 DIAGNOSIS — M7662 Achilles tendinitis, left leg: Secondary | ICD-10-CM

## 2014-10-07 DIAGNOSIS — Z9889 Other specified postprocedural states: Secondary | ICD-10-CM

## 2014-10-08 ENCOUNTER — Encounter: Payer: BLUE CROSS/BLUE SHIELD | Admitting: Podiatry

## 2014-10-10 NOTE — Progress Notes (Signed)
Patient ID: Driscilla MoatsSandra Hilscher, female   DOB: [birth year], 64 y.o.   MRN: [id number]  Subjective: 64 year old female presents the office they for follow-up evaluation 2 weeks status post retrocalcaneal exostectomy and tenolysis right foot. She states that she remain nonweightbearing. She says her pain is controlled. She denies any systemic complaints as fevers, chills, nausea, vomiting. Denies any calf pain, chest pain, since of breath. No other complaints this time in no acute changes since last appointment.  Objective: AAO 3, NAD Cast is removed. Calf is clean, dry, intact. DP/PT pulses palpable, CRT less than 3 seconds Protective sensation appears to be intact with Dorann OuSimms Weinstein monofilament Incision along the distal aspect of the Achilles tendon is well coapted without any evidence of dehiscence and staples are intact. There is no surrounding erythema, ascending cellulitis, drainage, malodor, purulence. There is no fluctuance or crepitus. There is mild to palpation upon surgical site. No other areas of tenderness to bilateral lower extremity. No pain with calf compression, swelling, warmth, erythema.  Assessment : 64 year old female 2 weeks status post retrocalcaneal spur excision right foot, doing well   Plan : -X-rays were obtained and reviewed with the patient.  -Treatment options discussed including all alternatives, risks, and complications -This time the incision is coapted however does not appear the staples are to be removed. Therefore they were left in place. Sterile dressing placed over the incision. Well-padded below-knee fiberglass cast was then applied. Continue nonweightbearing. Continue to monitor for any clinical signs or symptoms of infection or DVT/PE and directed to call the office immediately should any occur or go to the ER.  -Follow up in 1 week to remove the staples or sooner if any palms are to arise. In the meantime call the office with any questions or concerns.    Ovid CurdMatthew Wagoner, DPM

## 2014-10-13 ENCOUNTER — Ambulatory Visit (INDEPENDENT_AMBULATORY_CARE_PROVIDER_SITE_OTHER): Payer: BLUE CROSS/BLUE SHIELD | Admitting: Podiatry

## 2014-10-13 ENCOUNTER — Encounter: Payer: Self-pay | Admitting: Podiatry

## 2014-10-13 VITALS — BP 124/71 | HR 78 | Resp 16

## 2014-10-13 DIAGNOSIS — S86012D Strain of left Achilles tendon, subsequent encounter: Secondary | ICD-10-CM

## 2014-10-13 DIAGNOSIS — Z9889 Other specified postprocedural states: Secondary | ICD-10-CM

## 2014-10-13 NOTE — Progress Notes (Signed)
She presents today 3 weeks status post retrocalcaneal heel spur resection and Achilles tendon lysis. She presents today with a cast to the left lower extremity. She denies fever chills nausea vomiting muscle aches and pains and states that she is tired of this cast.  Objective: Vital signs are stable she is alert and oriented 3. Cast to the left lower extremity does demonstrate a dirty plantar aspect of the left cast without breakdown. Cast was removed demonstrating no skin breakdown. Mild edema to the posterior aspect of the heel and good closure of the incision site. She has +4 out of 5 plantar flexion against resistance.  Assessment: Well-healing surgical foot 3 weeks postop.  Plan: Discussed etiology pathology conservative versus surgical therapies. Placed her in a compression anklet for the mild edema and placed her in a Cam Walker. She will continue the use of the knee scooter for the next week and then transition between the knee scooter and ambulation. I will follow up with her in 2 weeks at which time we will consider shoe gear.

## 2014-10-19 NOTE — Progress Notes (Signed)
DOS 09/23/2014 Retrocalcaneal heel spur resection left, achilles tenolysis left, application of cast.  Pt is to be non-weight bearing until otherwise directed; rx for knee scooter written.

## 2014-10-27 ENCOUNTER — Ambulatory Visit (INDEPENDENT_AMBULATORY_CARE_PROVIDER_SITE_OTHER): Payer: BLUE CROSS/BLUE SHIELD | Admitting: Podiatry

## 2014-10-27 ENCOUNTER — Encounter: Payer: Self-pay | Admitting: Podiatry

## 2014-10-27 VITALS — BP 138/86 | HR 74 | Resp 12

## 2014-10-27 DIAGNOSIS — Z9889 Other specified postprocedural states: Secondary | ICD-10-CM

## 2014-10-27 DIAGNOSIS — M7662 Achilles tendinitis, left leg: Secondary | ICD-10-CM

## 2014-10-27 MED ORDER — MUPIROCIN 2 % EX OINT: TOPICAL_OINTMENT | CUTANEOUS | Status: DC

## 2014-10-27 MED ORDER — CLINDAMYCIN HCL 150 MG PO CAPS: 150.0000 mg | ORAL_CAPSULE | Freq: Three times a day (TID) | ORAL | Status: DC

## 2014-10-27 NOTE — Progress Notes (Signed)
She presents today 5 weeks status post retrocalcaneal heel spur resection and Achilles tendon repair. She states that it is still very tender particularly along the incision site distally. She denies fever chills nausea vomiting muscle aches and pains. States that she's been to the beach a couple of times but has not walked on the beach without her boot.  Objective: Vital signs are stable she's alert and 3 she has full range of motion without tenderness and full strength of her musculature with plantarflexion and dorsiflexion of her left foot. Superficial ulceration or dehiscence of the distalmost aspect of the wound is present. With some fibrin deposition. Mild erythema surrounding this but does not appear to be severely infected.  Assessment: Well-healing surgical foot with mild dehiscence and superficial infection distalmost aspect of the wound left.  Plan: Discussed etiology pathology conservative versus surgical therapies. At this point I recommended a light antibiotic consistent with clindamycin 150 mg tablets 1 by mouth 3 times a day. Also suggested that she washed the foot early daily with antibiotic and bacterial surgical scrub. Also suggested that she apply a small amount of Bactroban ointment after soaking in Epsom salts and water. I also encouraged a light dressing and regular tissue here. I will follow-up with her in 2 weeks.

## 2014-11-10 ENCOUNTER — Ambulatory Visit (INDEPENDENT_AMBULATORY_CARE_PROVIDER_SITE_OTHER): Payer: BLUE CROSS/BLUE SHIELD

## 2014-11-10 ENCOUNTER — Ambulatory Visit (INDEPENDENT_AMBULATORY_CARE_PROVIDER_SITE_OTHER): Payer: BLUE CROSS/BLUE SHIELD | Admitting: Podiatry

## 2014-11-10 ENCOUNTER — Encounter: Payer: Self-pay | Admitting: Podiatry

## 2014-11-10 VITALS — BP 133/78 | HR 78 | Resp 12

## 2014-11-10 DIAGNOSIS — Z9889 Other specified postprocedural states: Secondary | ICD-10-CM

## 2014-11-10 DIAGNOSIS — M779 Enthesopathy, unspecified: Secondary | ICD-10-CM

## 2014-11-10 NOTE — Progress Notes (Signed)
She presents today for follow-up of a small nonhealing wound to the posterior inferior aspect of her left heel where a retrocalcaneal spur resection was performed. She states it seems to be doing much better since she has taken the antibiotics and performs her daily therapies.   Objective: vital signs are stable she is alert and oriented 3. Pulses are strongly palpable. Neurologic sensorium is intact with Dorann Ou monofilament. Deep tendon reflexes are intact. Muscle strength is 5 over 5 plantar flexion. The wound demonstrates no erythema cellulitis drainage or odor and appears to be healing.   Assessment well-healing retrocalcaneal heel spur resection with Achilles tendon lysis date of surgery 09/23/2014 with a small nonhealing wound superficial.   Plan: Continue all conservative therapies soaking application of prescription antibiotic ointment and dressing daily I will follow-up with her in 2-3 weeks.

## 2014-12-01 ENCOUNTER — Encounter: Payer: Self-pay | Admitting: Podiatry

## 2014-12-01 ENCOUNTER — Ambulatory Visit (INDEPENDENT_AMBULATORY_CARE_PROVIDER_SITE_OTHER): Payer: BLUE CROSS/BLUE SHIELD | Admitting: Podiatry

## 2014-12-01 VITALS — BP 125/64 | HR 80 | Resp 16

## 2014-12-01 DIAGNOSIS — Z9889 Other specified postprocedural states: Secondary | ICD-10-CM

## 2014-12-01 DIAGNOSIS — M7662 Achilles tendinitis, left leg: Secondary | ICD-10-CM

## 2014-12-01 NOTE — Progress Notes (Signed)
She presents today 2 weeks status post retrocalcaneal heel spur resection with  Tenolysis  of the Achilles tendon. She states that her wound has finally healed and the skin feels much better. She states that the tendon is still sore and the foot hurts worse than it did before surgery. She states that she was just at the beach in walk in the sand which resulted in excruciating pain.   Objective: vital signs are stable alert and oriented 3. Pulses are palpable. Wound has gone on to heal 100% of the posterior foot. She still has tenderness on palpation of the Achilles tendon.   Assessment: chronic postop pain left heel.  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 However I am going to request that she begin physical therapy next week. I will follow-up with her in 1 month.

## 2014-12-20 ENCOUNTER — Encounter: Payer: Self-pay | Admitting: Podiatry

## 2014-12-20 ENCOUNTER — Ambulatory Visit (INDEPENDENT_AMBULATORY_CARE_PROVIDER_SITE_OTHER): Payer: BLUE CROSS/BLUE SHIELD

## 2014-12-20 ENCOUNTER — Ambulatory Visit (INDEPENDENT_AMBULATORY_CARE_PROVIDER_SITE_OTHER): Payer: BLUE CROSS/BLUE SHIELD | Admitting: Podiatry

## 2014-12-20 VITALS — BP 121/76 | HR 76 | Resp 16

## 2014-12-20 DIAGNOSIS — Z9889 Other specified postprocedural states: Secondary | ICD-10-CM

## 2014-12-20 NOTE — Progress Notes (Signed)
She presents today for follow-up of her surgery from 09/23/2014. She states that she is ready to get back to work. She states that physical therapy has been doing very good job in her leg and foot film much improved. She denies fever chills nausea vomiting muscle aches and pains.  Objective: Vital signs are stable she is alert and oriented 3. Minimal edema to the insertion site but she has a full range of motion without pain on palpation of the Achilles. Radiographs taken in the office today demonstrate well healing surgical site. Soft tissue margins are delineated very nicely. It appears to be healing at this point.  Assessment: Well-healing Achilles tendon repair and retrocalcaneal heel spur resection date of surgery 09/23/2014.  Plan: Discussed etiology pathology conservative versus surgical therapies. We will release her to get back to work and I will follow-up with her in 6 weeks. She will continue physical therapy for the next month.  Arbutus Ped DPM

## 2015-01-31 ENCOUNTER — Encounter: Payer: Self-pay | Admitting: Podiatry

## 2015-01-31 ENCOUNTER — Ambulatory Visit (INDEPENDENT_AMBULATORY_CARE_PROVIDER_SITE_OTHER): Payer: BLUE CROSS/BLUE SHIELD | Admitting: Podiatry

## 2015-01-31 VITALS — BP 130/65 | HR 74 | Resp 16

## 2015-01-31 DIAGNOSIS — Z9889 Other specified postprocedural states: Secondary | ICD-10-CM

## 2015-01-31 DIAGNOSIS — S86012D Strain of left Achilles tendon, subsequent encounter: Secondary | ICD-10-CM

## 2015-01-31 NOTE — Progress Notes (Signed)
She presents today for follow-up of her Achilles tendon lysis and retrocalcaneal heel spur resection left foot. Date of surgery 09/23/2014. She denies fever chills nausea vomiting muscle aches and pains shortness of breath or chest pain. She states that she's doing much better and has full strength to the left lower extremity states the physical therapy seems to have really assisted her in her regaining her strength and balance.  Objective: Vital signs are stable she is alert and oriented 3. Pulses are strongly palpable and she has 5 over 5 plantar flexion of the Achilles tendon without pain. She has r regained definition of her ankle and of the Achilles tendon area.  Assessment: Well-healing surgical foot left.  Plan: We'll allow her to get back to her regular routine and I will follow-up with her as needed.

## 2015-06-21 ENCOUNTER — Other Ambulatory Visit: Payer: Self-pay

## 2015-06-21 DIAGNOSIS — Z1231 Encounter for screening mammogram for malignant neoplasm of breast: Secondary | ICD-10-CM

## 2015-07-06 ENCOUNTER — Ambulatory Visit
Admission: RE | Admit: 2015-07-06 | Discharge: 2015-07-06 | Disposition: A | Discharge status: Home / Self Care | Payer: BLUE CROSS/BLUE SHIELD | Source: Ambulatory Visit

## 2015-07-06 DIAGNOSIS — Z1231 Encounter for screening mammogram for malignant neoplasm of breast: Secondary | ICD-10-CM

## 2015-09-18 DIAGNOSIS — Z85820 Personal history of malignant melanoma of skin: Secondary | ICD-10-CM | POA: Diagnosis not present

## 2015-09-18 DIAGNOSIS — D2271 Melanocytic nevi of right lower limb, including hip: Secondary | ICD-10-CM | POA: Diagnosis not present

## 2015-09-18 DIAGNOSIS — D225 Melanocytic nevi of trunk: Secondary | ICD-10-CM | POA: Diagnosis not present

## 2015-09-18 DIAGNOSIS — Z86018 Personal history of other benign neoplasm: Secondary | ICD-10-CM | POA: Diagnosis not present

## 2016-05-06 DIAGNOSIS — R002 Palpitations: Secondary | ICD-10-CM | POA: Diagnosis not present

## 2016-05-06 DIAGNOSIS — Z0001 Encounter for general adult medical examination with abnormal findings: Secondary | ICD-10-CM | POA: Diagnosis not present

## 2016-05-06 DIAGNOSIS — G2581 Restless legs syndrome: Secondary | ICD-10-CM | POA: Diagnosis not present

## 2016-05-06 DIAGNOSIS — N959 Unspecified menopausal and perimenopausal disorder: Secondary | ICD-10-CM | POA: Diagnosis not present

## 2016-05-06 DIAGNOSIS — Z23 Encounter for immunization: Secondary | ICD-10-CM | POA: Diagnosis not present

## 2016-05-06 DIAGNOSIS — M545 Low back pain: Secondary | ICD-10-CM | POA: Diagnosis not present

## 2016-05-07 ENCOUNTER — Telehealth: Payer: Self-pay

## 2016-05-07 NOTE — Telephone Encounter (Signed)
SENT NOTES TO SCHEDULING 

## 2016-05-08 ENCOUNTER — Encounter: Payer: Self-pay | Admitting: *Deleted

## 2016-05-09 DIAGNOSIS — M5030 Other cervical disc degeneration, unspecified cervical region: Secondary | ICD-10-CM | POA: Diagnosis not present

## 2016-05-09 DIAGNOSIS — G44201 Tension-type headache, unspecified, intractable: Secondary | ICD-10-CM | POA: Diagnosis not present

## 2016-05-09 DIAGNOSIS — M9901 Segmental and somatic dysfunction of cervical region: Secondary | ICD-10-CM | POA: Diagnosis not present

## 2016-05-09 DIAGNOSIS — M9903 Segmental and somatic dysfunction of lumbar region: Secondary | ICD-10-CM | POA: Diagnosis not present

## 2016-05-13 DIAGNOSIS — G44201 Tension-type headache, unspecified, intractable: Secondary | ICD-10-CM | POA: Diagnosis not present

## 2016-05-13 DIAGNOSIS — M9903 Segmental and somatic dysfunction of lumbar region: Secondary | ICD-10-CM | POA: Diagnosis not present

## 2016-05-13 DIAGNOSIS — M9901 Segmental and somatic dysfunction of cervical region: Secondary | ICD-10-CM | POA: Diagnosis not present

## 2016-05-13 DIAGNOSIS — M5030 Other cervical disc degeneration, unspecified cervical region: Secondary | ICD-10-CM | POA: Diagnosis not present

## 2016-05-14 DIAGNOSIS — E559 Vitamin D deficiency, unspecified: Secondary | ICD-10-CM | POA: Diagnosis not present

## 2016-05-14 DIAGNOSIS — E785 Hyperlipidemia, unspecified: Secondary | ICD-10-CM | POA: Diagnosis not present

## 2016-05-14 DIAGNOSIS — Z0001 Encounter for general adult medical examination with abnormal findings: Secondary | ICD-10-CM | POA: Diagnosis not present

## 2016-05-15 DIAGNOSIS — M9903 Segmental and somatic dysfunction of lumbar region: Secondary | ICD-10-CM | POA: Diagnosis not present

## 2016-05-15 DIAGNOSIS — M5030 Other cervical disc degeneration, unspecified cervical region: Secondary | ICD-10-CM | POA: Diagnosis not present

## 2016-05-15 DIAGNOSIS — G44201 Tension-type headache, unspecified, intractable: Secondary | ICD-10-CM | POA: Diagnosis not present

## 2016-05-15 DIAGNOSIS — M9901 Segmental and somatic dysfunction of cervical region: Secondary | ICD-10-CM | POA: Diagnosis not present

## 2016-05-16 ENCOUNTER — Encounter: Payer: Self-pay | Admitting: Cardiology

## 2016-05-16 ENCOUNTER — Encounter (INDEPENDENT_AMBULATORY_CARE_PROVIDER_SITE_OTHER): Payer: Self-pay

## 2016-05-16 ENCOUNTER — Ambulatory Visit (INDEPENDENT_AMBULATORY_CARE_PROVIDER_SITE_OTHER): Payer: BLUE CROSS/BLUE SHIELD | Admitting: Cardiology

## 2016-05-16 DIAGNOSIS — G44201 Tension-type headache, unspecified, intractable: Secondary | ICD-10-CM | POA: Diagnosis not present

## 2016-05-16 DIAGNOSIS — R079 Chest pain, unspecified: Secondary | ICD-10-CM | POA: Diagnosis not present

## 2016-05-16 DIAGNOSIS — I499 Cardiac arrhythmia, unspecified: Secondary | ICD-10-CM

## 2016-05-16 DIAGNOSIS — M5030 Other cervical disc degeneration, unspecified cervical region: Secondary | ICD-10-CM | POA: Diagnosis not present

## 2016-05-16 DIAGNOSIS — M9901 Segmental and somatic dysfunction of cervical region: Secondary | ICD-10-CM | POA: Diagnosis not present

## 2016-05-16 DIAGNOSIS — M9903 Segmental and somatic dysfunction of lumbar region: Secondary | ICD-10-CM | POA: Diagnosis not present

## 2016-05-16 DIAGNOSIS — R0609 Other forms of dyspnea: Secondary | ICD-10-CM

## 2016-05-16 NOTE — Patient Instructions (Signed)
Medication Instructions:  Your physician recommends that you continue on your current medications as directed. Please refer to the Current Medication list given to you today.   Labwork: None  Testing/Procedures: Your physician has requested that you have an echocardiogram. Echocardiography is a painless test that uses sound waves to create images of your heart. It provides your doctor with information about the size and shape of your heart and how well your heart's chambers and valves are working. This procedure takes approximately one hour. There are no restrictions for this procedure.  Your physician has requested that you have an exercise tolerance test. For further information please visit www.[email or web address]. Please also follow instruction sheet, as given.  Your physician has recommended that you wear an event monitor. Event monitors are medical devices that record the heart's electrical activity. Doctors most often us these monitors to diagnose arrhythmias. Arrhythmias are problems with the speed or rhythm of the heartbeat. The monitor is a small, portable device. You can wear one while you do your normal daily activities. This is usually used to diagnose what is causing palpitations/syncope (passing out).  Follow-Up: Your physician recommends that you schedule a follow-up appointment AS NEEDED with Dr. Turner pending study results.  Any Other Special Instructions Will Be Listed Below (If Applicable).     If you need a refill on your cardiac medications before your next appointment, please call your pharmacy.   

## 2016-05-16 NOTE — Progress Notes (Signed)
Cardiology Office Note    Date:  05/16/2016   ID:  Kathy Fernandez, DOB [birth year], MRN [id number]  PCP:  Clayborn Heron, MD  Cardiologist:  Armanda Magic, MD   Chief Complaint  Patient presents with  . New Evaluation    palpitations    History of Present Illness:  Kathy Fernandez is a 66 y.o. female with a history of hypothyroidism and OSA who presents today for evaluation of tachycardia.  She went in for a routine PE and was noted to have an irregularity on her heart exam.  She says that she will occasionally notice a little flutter that makes her cough but nothing sustained.  She has had some pressure in her chest at random times but does occur with exertion walking up hills.  There is no radiation of the discomfort.  There are no associated symptoms of nausea, diaphoresis with the discomfort.  It resolves with rest.  She says that it occurs several times weekly.  She says that she is under more stress and much more active recently with a home renovation and moving so she has been doing more physical labor.  She has definitely noticed DOE with normal activity recently which she attributes to deconditioning and being overweight.  She denies any tobacco use and no family history of heart disease  She occasionally has LE edema but is on her feet a lot and also had an achilles tendon repair.  She denies any dizziness, syncope, PND or orthopnea.       Past Medical History:  Diagnosis Date  . Achilles tendon tear   . Hypothyroidism   . Polymyalgia rheumatica (HCC)   . PONV (postoperative nausea and vomiting)   . Sleep apnea     Past Surgical History:  Procedure Laterality Date  . ABDOMINAL HYSTERECTOMY  2005  . CHOLECYSTECTOMY  2007  . DIAGNOSTIC LAPAROSCOPY      Current Medications: Current Meds  Medication Sig  . FLUoxetine (PROZAC) 20 MG capsule Take 20 mg by mouth daily.  Marland Kitchen levothyroxine (SYNTHROID, LEVOTHROID) 50 MCG tablet Take 50 mcg by mouth daily before breakfast.  .  mupirocin ointment (BACTROBAN) 2 % Apply to wound twice a day.  . naproxen sodium (ANAPROX) 220 MG tablet Take 220 mg by mouth 2 (two) times daily with a meal.  . omeprazole (PRILOSEC) 40 MG capsule Take 40 mg by mouth daily.  . pramipexole (MIRAPEX) 0.5 MG tablet Take 0.5 mg by mouth 3 (three) times daily.    Allergies:   Penicillins; Codeine; Neomycin-bacitracin zn-polymyx; and Neosporin  [neomycin-polymyxin-gramicidin]   Social History   Social History  . Marital status: Single    Spouse name: N/A  . Number of children: N/A  . Years of education: N/A   Social History Main Topics  . Smoking status: Never Smoker  . Smokeless tobacco: Never Used  . Alcohol use No  . Drug use: No  . Sexual activity: Not Asked   Other Topics Concern  . None   Social History Narrative  . None     Family History:  The patient's family history includes Bipolar disorder in her son; CVA in her mother; Diabetes in her mother; Gout in her father; Heart failure in her father; Hypertension in her father; Hypothyroidism in her mother; Polycystic kidney disease in her daughter; Rashes / Skin problems in her sister; Skin cancer in her father.   ROS:   Please see the history of present illness.    ROS All other  systems reviewed and are negative.  No flowsheet data found.     PHYSICAL EXAM:   VS:  BP 135/60   Pulse 68   Ht 5\' 6"  (1.676 m)   Wt 261 lb 1.9 oz (118.4 kg)   BMI 42.15 kg/m    GEN: Well nourished, well developed, in no acute distress  HEENT: normal  Neck: no JVD, carotid bruits, or masses Cardiac: RRR; no murmurs, rubs, or gallops,no edema.  Intact distal pulses bilaterally.  Respiratory:  clear to auscultation bilaterally, normal work of breathing GI: soft, nontender, nondistended, + BS MS: no deformity or atrophy  Skin: warm and dry, no rash Neuro:  Alert and Oriented x 3, Strength and sensation are intact Psych: euthymic mood, full affect  Wt Readings from Last 3 Encounters:    05/16/16 261 lb 1.9 oz (118.4 kg)  08/04/14 260 lb (117.9 kg)  07/12/14 260 lb (117.9 kg)      Studies/Labs Reviewed:   EKG:  EKG is ordered today.  The ekg ordered today demonstrates NSR at 68bpm with no ST changes  Recent Labs: No results found for requested labs within last 8760 hours.   Lipid Panel No results found for: CHOL, TRIG, HDL, CHOLHDL, VLDL, LDLCALC, LDLDIRECT  Additional studies/ records that were reviewed today include:  Office notes    ASSESSMENT:    1. Chest pain, unspecified type   2. DOE (dyspnea on exertion)   3. Irregular heart beat      PLAN:  In order of problems listed above:  1. Chest pain that may be related to increased exertion recently with her house renovation and packing but I am concerned that it is exertional and she is more fatigued with new DOE.  Her EKG is nonischemic and she has never smoked.  There is no family history of CAD.  I will get an ETT to rule out ischemia.   2. DOE - likely due to obesity and deconditioning.  I will get a 2D echo to assess LVF and diastolic function.  3. Irregular heart beat - Her EKG is normal so I will get event monitor to assess further and make sure she is not having PAF.      Medication Adjustments/Labs and Tests Ordered: Current medicines are reviewed at length with the patient today.  Concerns regarding medicines are outlined above.  Medication changes, Labs and Tests ordered today are listed in the Patient Instructions below.  There are no Patient Instructions on file for this visit.   Signed, Armanda Magicraci Taevon Aschoff, MD  05/16/2016 12:03 PM    [facility] [address], [place], [place]  [address] Phone: [phone]; Fax: [phone]

## 2016-05-21 DIAGNOSIS — M5030 Other cervical disc degeneration, unspecified cervical region: Secondary | ICD-10-CM | POA: Diagnosis not present

## 2016-05-21 DIAGNOSIS — M9903 Segmental and somatic dysfunction of lumbar region: Secondary | ICD-10-CM | POA: Diagnosis not present

## 2016-05-21 DIAGNOSIS — G44201 Tension-type headache, unspecified, intractable: Secondary | ICD-10-CM | POA: Diagnosis not present

## 2016-05-21 DIAGNOSIS — M9901 Segmental and somatic dysfunction of cervical region: Secondary | ICD-10-CM | POA: Diagnosis not present

## 2016-05-22 ENCOUNTER — Ambulatory Visit (INDEPENDENT_AMBULATORY_CARE_PROVIDER_SITE_OTHER): Payer: BLUE CROSS/BLUE SHIELD

## 2016-05-22 DIAGNOSIS — G44201 Tension-type headache, unspecified, intractable: Secondary | ICD-10-CM | POA: Diagnosis not present

## 2016-05-22 DIAGNOSIS — M5030 Other cervical disc degeneration, unspecified cervical region: Secondary | ICD-10-CM | POA: Diagnosis not present

## 2016-05-22 DIAGNOSIS — I499 Cardiac arrhythmia, unspecified: Secondary | ICD-10-CM | POA: Diagnosis not present

## 2016-05-22 DIAGNOSIS — M9903 Segmental and somatic dysfunction of lumbar region: Secondary | ICD-10-CM | POA: Diagnosis not present

## 2016-05-22 DIAGNOSIS — M9901 Segmental and somatic dysfunction of cervical region: Secondary | ICD-10-CM | POA: Diagnosis not present

## 2016-05-23 DIAGNOSIS — M5030 Other cervical disc degeneration, unspecified cervical region: Secondary | ICD-10-CM | POA: Diagnosis not present

## 2016-05-23 DIAGNOSIS — M9901 Segmental and somatic dysfunction of cervical region: Secondary | ICD-10-CM | POA: Diagnosis not present

## 2016-05-23 DIAGNOSIS — G44201 Tension-type headache, unspecified, intractable: Secondary | ICD-10-CM | POA: Diagnosis not present

## 2016-05-23 DIAGNOSIS — M9903 Segmental and somatic dysfunction of lumbar region: Secondary | ICD-10-CM | POA: Diagnosis not present

## 2016-05-28 DIAGNOSIS — G44201 Tension-type headache, unspecified, intractable: Secondary | ICD-10-CM | POA: Diagnosis not present

## 2016-05-28 DIAGNOSIS — M9903 Segmental and somatic dysfunction of lumbar region: Secondary | ICD-10-CM | POA: Diagnosis not present

## 2016-05-28 DIAGNOSIS — M9901 Segmental and somatic dysfunction of cervical region: Secondary | ICD-10-CM | POA: Diagnosis not present

## 2016-05-28 DIAGNOSIS — M5030 Other cervical disc degeneration, unspecified cervical region: Secondary | ICD-10-CM | POA: Diagnosis not present

## 2016-05-30 DIAGNOSIS — M9901 Segmental and somatic dysfunction of cervical region: Secondary | ICD-10-CM | POA: Diagnosis not present

## 2016-05-30 DIAGNOSIS — M9903 Segmental and somatic dysfunction of lumbar region: Secondary | ICD-10-CM | POA: Diagnosis not present

## 2016-05-30 DIAGNOSIS — M5030 Other cervical disc degeneration, unspecified cervical region: Secondary | ICD-10-CM | POA: Diagnosis not present

## 2016-05-30 DIAGNOSIS — G44201 Tension-type headache, unspecified, intractable: Secondary | ICD-10-CM | POA: Diagnosis not present

## 2016-06-04 ENCOUNTER — Other Ambulatory Visit (HOSPITAL_COMMUNITY): Payer: BLUE CROSS/BLUE SHIELD

## 2016-06-04 DIAGNOSIS — G44201 Tension-type headache, unspecified, intractable: Secondary | ICD-10-CM | POA: Diagnosis not present

## 2016-06-04 DIAGNOSIS — M9901 Segmental and somatic dysfunction of cervical region: Secondary | ICD-10-CM | POA: Diagnosis not present

## 2016-06-04 DIAGNOSIS — M5030 Other cervical disc degeneration, unspecified cervical region: Secondary | ICD-10-CM | POA: Diagnosis not present

## 2016-06-04 DIAGNOSIS — M9903 Segmental and somatic dysfunction of lumbar region: Secondary | ICD-10-CM | POA: Diagnosis not present

## 2016-06-06 DIAGNOSIS — M5030 Other cervical disc degeneration, unspecified cervical region: Secondary | ICD-10-CM | POA: Diagnosis not present

## 2016-06-06 DIAGNOSIS — G44201 Tension-type headache, unspecified, intractable: Secondary | ICD-10-CM | POA: Diagnosis not present

## 2016-06-06 DIAGNOSIS — M9903 Segmental and somatic dysfunction of lumbar region: Secondary | ICD-10-CM | POA: Diagnosis not present

## 2016-06-06 DIAGNOSIS — M9901 Segmental and somatic dysfunction of cervical region: Secondary | ICD-10-CM | POA: Diagnosis not present

## 2016-06-11 DIAGNOSIS — M9903 Segmental and somatic dysfunction of lumbar region: Secondary | ICD-10-CM | POA: Diagnosis not present

## 2016-06-11 DIAGNOSIS — M5030 Other cervical disc degeneration, unspecified cervical region: Secondary | ICD-10-CM | POA: Diagnosis not present

## 2016-06-11 DIAGNOSIS — M9901 Segmental and somatic dysfunction of cervical region: Secondary | ICD-10-CM | POA: Diagnosis not present

## 2016-06-11 DIAGNOSIS — G44201 Tension-type headache, unspecified, intractable: Secondary | ICD-10-CM | POA: Diagnosis not present

## 2016-06-13 DIAGNOSIS — M9901 Segmental and somatic dysfunction of cervical region: Secondary | ICD-10-CM | POA: Diagnosis not present

## 2016-06-13 DIAGNOSIS — M9903 Segmental and somatic dysfunction of lumbar region: Secondary | ICD-10-CM | POA: Diagnosis not present

## 2016-06-13 DIAGNOSIS — M5030 Other cervical disc degeneration, unspecified cervical region: Secondary | ICD-10-CM | POA: Diagnosis not present

## 2016-06-13 DIAGNOSIS — G44201 Tension-type headache, unspecified, intractable: Secondary | ICD-10-CM | POA: Diagnosis not present

## 2016-06-20 ENCOUNTER — Encounter (INDEPENDENT_AMBULATORY_CARE_PROVIDER_SITE_OTHER): Payer: Self-pay

## 2016-06-20 ENCOUNTER — Other Ambulatory Visit: Payer: Self-pay

## 2016-06-20 ENCOUNTER — Ambulatory Visit (INDEPENDENT_AMBULATORY_CARE_PROVIDER_SITE_OTHER): Payer: BLUE CROSS/BLUE SHIELD

## 2016-06-20 ENCOUNTER — Ambulatory Visit (HOSPITAL_COMMUNITY): Payer: BLUE CROSS/BLUE SHIELD | Attending: Cardiovascular Disease

## 2016-06-20 DIAGNOSIS — I371 Nonrheumatic pulmonary valve insufficiency: Secondary | ICD-10-CM | POA: Insufficient documentation

## 2016-06-20 DIAGNOSIS — R002 Palpitations: Secondary | ICD-10-CM | POA: Diagnosis not present

## 2016-06-20 DIAGNOSIS — I071 Rheumatic tricuspid insufficiency: Secondary | ICD-10-CM | POA: Insufficient documentation

## 2016-06-20 DIAGNOSIS — Z8249 Family history of ischemic heart disease and other diseases of the circulatory system: Secondary | ICD-10-CM | POA: Insufficient documentation

## 2016-06-20 DIAGNOSIS — R0609 Other forms of dyspnea: Secondary | ICD-10-CM

## 2016-06-20 DIAGNOSIS — G4733 Obstructive sleep apnea (adult) (pediatric): Secondary | ICD-10-CM | POA: Insufficient documentation

## 2016-06-20 DIAGNOSIS — R079 Chest pain, unspecified: Secondary | ICD-10-CM

## 2016-06-20 LAB — EXERCISE TOLERANCE TEST
CHL CUP MPHR: 156 {beats}/min
CHL CUP STRESS STAGE 1 GRADE: 0 %
CHL CUP STRESS STAGE 1 SPEED: 0 mph
CHL CUP STRESS STAGE 2 GRADE: 0 %
CHL CUP STRESS STAGE 2 HR: 82 {beats}/min
CHL CUP STRESS STAGE 2 SPEED: 0 mph
CHL CUP STRESS STAGE 3 HR: 80 {beats}/min
CHL CUP STRESS STAGE 4 SPEED: 1 mph
CHL CUP STRESS STAGE 5 DBP: 67 mmHg
CHL CUP STRESS STAGE 5 GRADE: 10 %
CHL CUP STRESS STAGE 5 SBP: 207 mmHg
CHL CUP STRESS STAGE 5 SPEED: 1.7 mph
CHL CUP STRESS STAGE 6 GRADE: 12 %
CHL CUP STRESS STAGE 7 HR: 90 {beats}/min
CHL CUP STRESS STAGE 8 SBP: 134 mmHg
CSEPPHR: 133 {beats}/min
Estimated workload: 7 METS
Exercise duration (min): 5 min
Exercise duration (sec): 38 s
Percent HR: 85 %
Percent of predicted max HR: 85 %
RPE: 18
Rest HR: 71 {beats}/min
Stage 1 DBP: 80 mmHg
Stage 1 HR: 81 {beats}/min
Stage 1 SBP: 135 mmHg
Stage 3 Grade: 0 %
Stage 3 Speed: 1 mph
Stage 4 Grade: 0 %
Stage 4 HR: 80 {beats}/min
Stage 5 HR: 112 {beats}/min
Stage 6 HR: 133 {beats}/min
Stage 6 Speed: 2.5 mph
Stage 7 Grade: 0 %
Stage 7 Speed: 0 mph
Stage 8 DBP: 85 mmHg
Stage 8 Grade: 0 %
Stage 8 HR: 81 {beats}/min
Stage 8 Speed: 0 mph

## 2016-06-24 ENCOUNTER — Other Ambulatory Visit: Payer: Self-pay | Admitting: *Deleted

## 2016-06-24 DIAGNOSIS — M9901 Segmental and somatic dysfunction of cervical region: Secondary | ICD-10-CM | POA: Diagnosis not present

## 2016-06-24 DIAGNOSIS — R0609 Other forms of dyspnea: Secondary | ICD-10-CM

## 2016-06-24 DIAGNOSIS — M5030 Other cervical disc degeneration, unspecified cervical region: Secondary | ICD-10-CM | POA: Diagnosis not present

## 2016-06-24 DIAGNOSIS — M9903 Segmental and somatic dysfunction of lumbar region: Secondary | ICD-10-CM | POA: Diagnosis not present

## 2016-06-24 DIAGNOSIS — G44201 Tension-type headache, unspecified, intractable: Secondary | ICD-10-CM | POA: Diagnosis not present

## 2016-06-25 DIAGNOSIS — G44201 Tension-type headache, unspecified, intractable: Secondary | ICD-10-CM | POA: Diagnosis not present

## 2016-06-25 DIAGNOSIS — M5030 Other cervical disc degeneration, unspecified cervical region: Secondary | ICD-10-CM | POA: Diagnosis not present

## 2016-06-25 DIAGNOSIS — M9901 Segmental and somatic dysfunction of cervical region: Secondary | ICD-10-CM | POA: Diagnosis not present

## 2016-06-25 DIAGNOSIS — M9903 Segmental and somatic dysfunction of lumbar region: Secondary | ICD-10-CM | POA: Diagnosis not present

## 2016-07-04 DIAGNOSIS — M5030 Other cervical disc degeneration, unspecified cervical region: Secondary | ICD-10-CM | POA: Diagnosis not present

## 2016-07-04 DIAGNOSIS — G44201 Tension-type headache, unspecified, intractable: Secondary | ICD-10-CM | POA: Diagnosis not present

## 2016-07-04 DIAGNOSIS — M9903 Segmental and somatic dysfunction of lumbar region: Secondary | ICD-10-CM | POA: Diagnosis not present

## 2016-07-04 DIAGNOSIS — M9901 Segmental and somatic dysfunction of cervical region: Secondary | ICD-10-CM | POA: Diagnosis not present

## 2016-07-11 DIAGNOSIS — M9901 Segmental and somatic dysfunction of cervical region: Secondary | ICD-10-CM | POA: Diagnosis not present

## 2016-07-11 DIAGNOSIS — M9903 Segmental and somatic dysfunction of lumbar region: Secondary | ICD-10-CM | POA: Diagnosis not present

## 2016-07-11 DIAGNOSIS — G44201 Tension-type headache, unspecified, intractable: Secondary | ICD-10-CM | POA: Diagnosis not present

## 2016-07-11 DIAGNOSIS — M5030 Other cervical disc degeneration, unspecified cervical region: Secondary | ICD-10-CM | POA: Diagnosis not present

## 2016-07-25 DIAGNOSIS — M9903 Segmental and somatic dysfunction of lumbar region: Secondary | ICD-10-CM | POA: Diagnosis not present

## 2016-07-25 DIAGNOSIS — G44201 Tension-type headache, unspecified, intractable: Secondary | ICD-10-CM | POA: Diagnosis not present

## 2016-07-25 DIAGNOSIS — M9901 Segmental and somatic dysfunction of cervical region: Secondary | ICD-10-CM | POA: Diagnosis not present

## 2016-07-25 DIAGNOSIS — M5030 Other cervical disc degeneration, unspecified cervical region: Secondary | ICD-10-CM | POA: Diagnosis not present

## 2016-08-05 DIAGNOSIS — M5030 Other cervical disc degeneration, unspecified cervical region: Secondary | ICD-10-CM | POA: Diagnosis not present

## 2016-08-05 DIAGNOSIS — G44201 Tension-type headache, unspecified, intractable: Secondary | ICD-10-CM | POA: Diagnosis not present

## 2016-08-05 DIAGNOSIS — M9903 Segmental and somatic dysfunction of lumbar region: Secondary | ICD-10-CM | POA: Diagnosis not present

## 2016-08-05 DIAGNOSIS — M9901 Segmental and somatic dysfunction of cervical region: Secondary | ICD-10-CM | POA: Diagnosis not present

## 2016-08-08 DIAGNOSIS — M5030 Other cervical disc degeneration, unspecified cervical region: Secondary | ICD-10-CM | POA: Diagnosis not present

## 2016-08-08 DIAGNOSIS — G44201 Tension-type headache, unspecified, intractable: Secondary | ICD-10-CM | POA: Diagnosis not present

## 2016-08-08 DIAGNOSIS — M9901 Segmental and somatic dysfunction of cervical region: Secondary | ICD-10-CM | POA: Diagnosis not present

## 2016-08-08 DIAGNOSIS — M9903 Segmental and somatic dysfunction of lumbar region: Secondary | ICD-10-CM | POA: Diagnosis not present

## 2016-08-12 DIAGNOSIS — M9901 Segmental and somatic dysfunction of cervical region: Secondary | ICD-10-CM | POA: Diagnosis not present

## 2016-08-12 DIAGNOSIS — M9903 Segmental and somatic dysfunction of lumbar region: Secondary | ICD-10-CM | POA: Diagnosis not present

## 2016-08-12 DIAGNOSIS — G44201 Tension-type headache, unspecified, intractable: Secondary | ICD-10-CM | POA: Diagnosis not present

## 2016-08-12 DIAGNOSIS — M5030 Other cervical disc degeneration, unspecified cervical region: Secondary | ICD-10-CM | POA: Diagnosis not present

## 2016-09-13 IMAGING — MR MR ANKLE*L* W/O CM
4 of 5 series · 19 of 40 positions shown · non-contrast
Comparison: None.

CLINICAL DATA: History of fall 2-3 months ago with left heel and
ankle pain. Question Achilles tendon tear.

EXAM:
MRI OF THE LEFT ANKLE WITHOUT CONTRAST
TECHNIQUE: Multiplanar, multisequence MR imaging of the ankle was performed. No
intravenous contrast was administered.

[Series 2: T1 · axial · 4.0mm · 0.27mm/px · z∈[-91,+49]mm · 4 of 36 slices shown (1 of 2)]
[im 1/36]
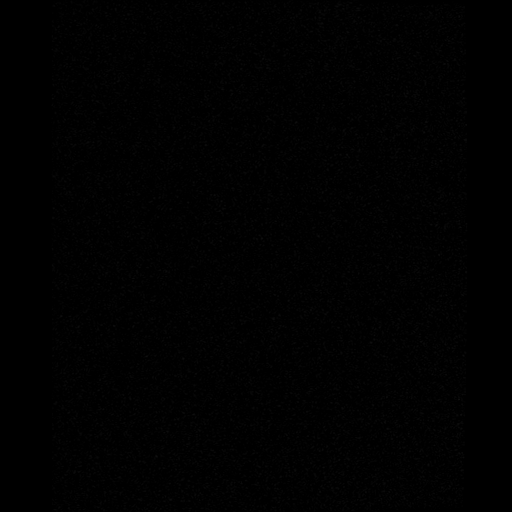
[im 4/36]
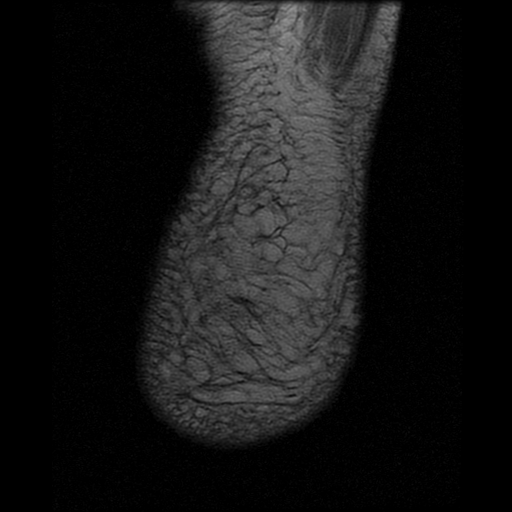
[im 20/36]
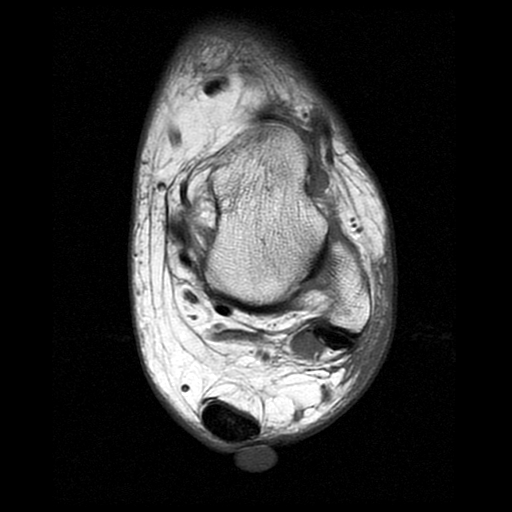
[im 32/36]
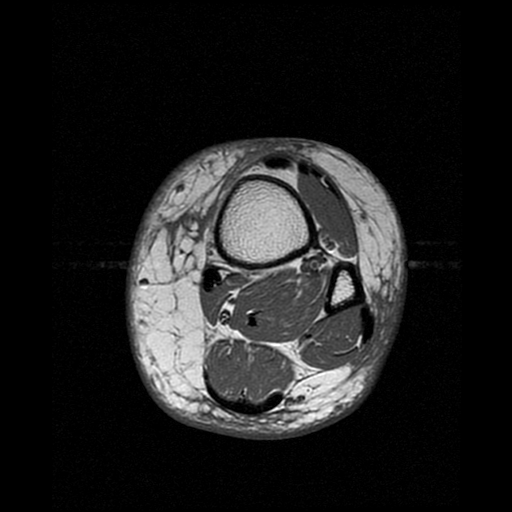

[Series 3: T2 · axial · 4.0mm · 0.27mm/px · z∈[-73,+44]mm · 3 of 36 slices shown]
[im 5/36]
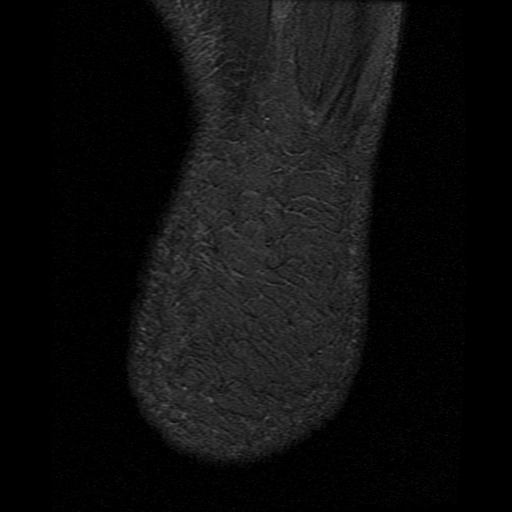
[im 18/36]
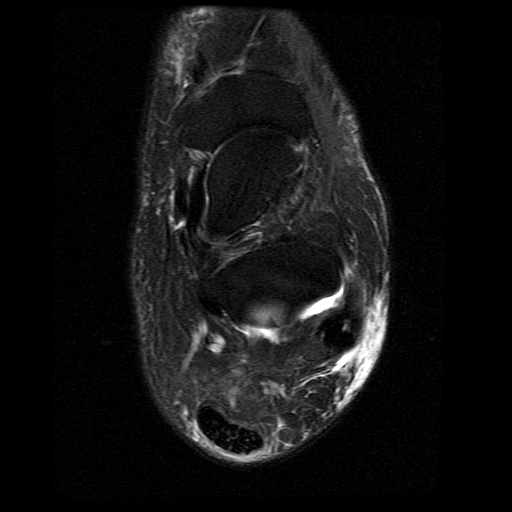
[im 31/36]
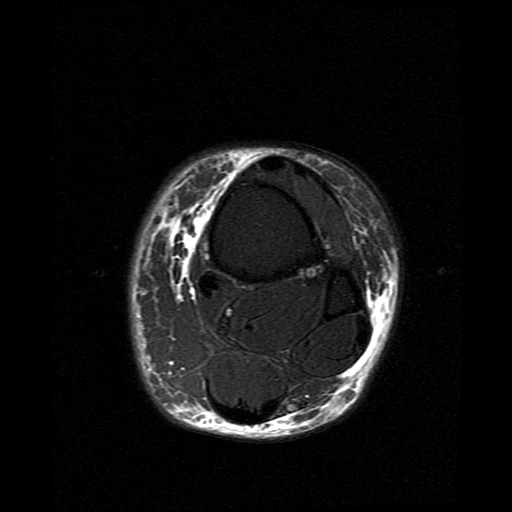

[Series 4: T2 fat-sat · coronal · 3.0mm · 0.31mm/px · 9 of 36 slices shown]
[im 1/36]
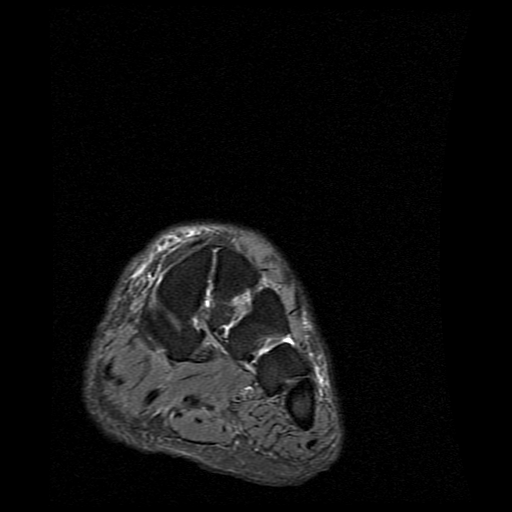
[im 5/36]
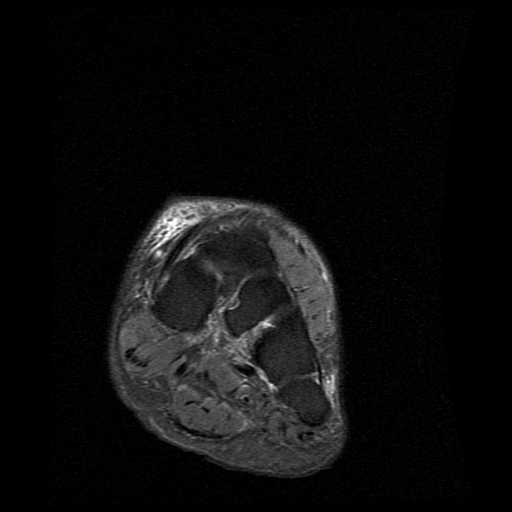
[im 9/36]
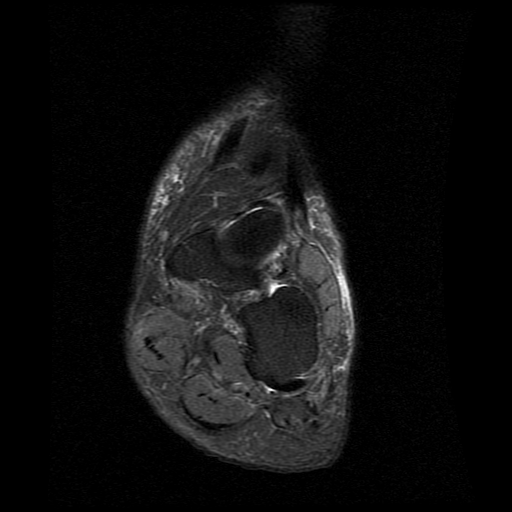
[im 14/36]
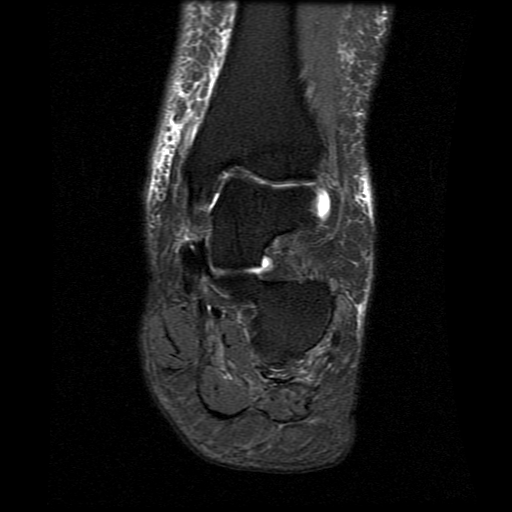
[im 18/36]
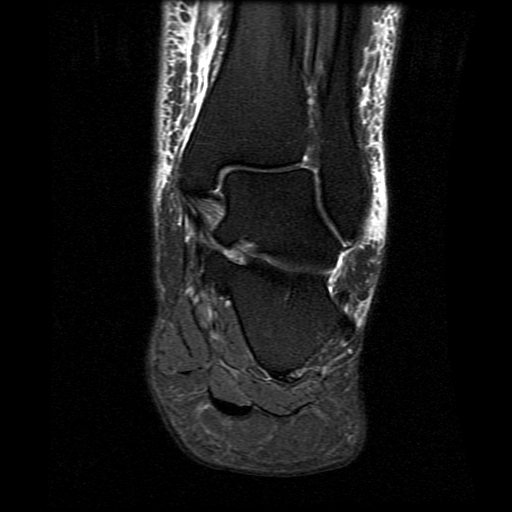
[im 22/36]
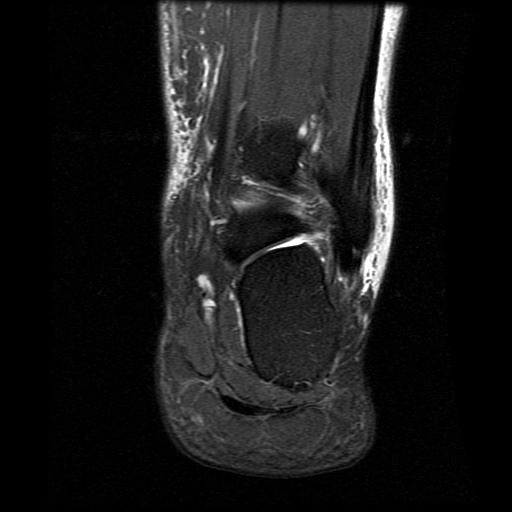
[im 27/36]
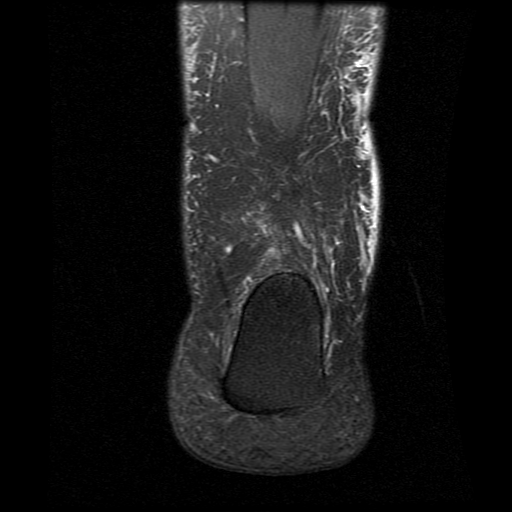
[im 31/36]
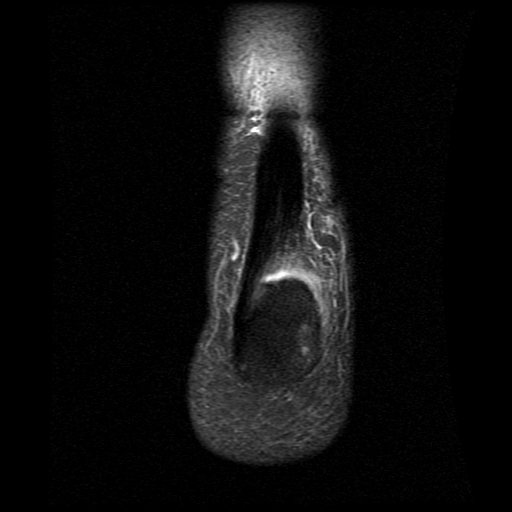
[im 36/36]
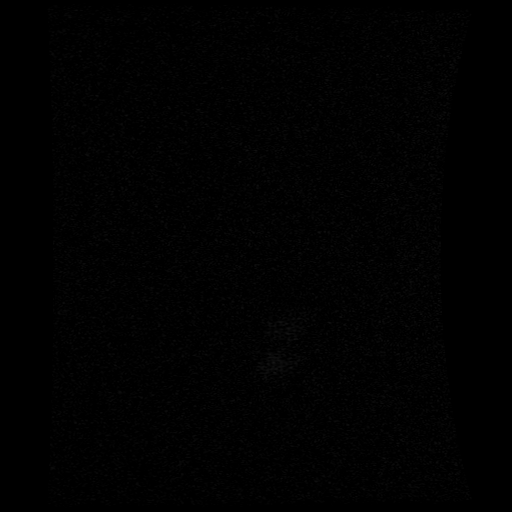

[Series 5: T1 · sagittal · 3.0mm · 0.31mm/px · 3 of 26 slices shown (2 of 2)]
[im 6/26]
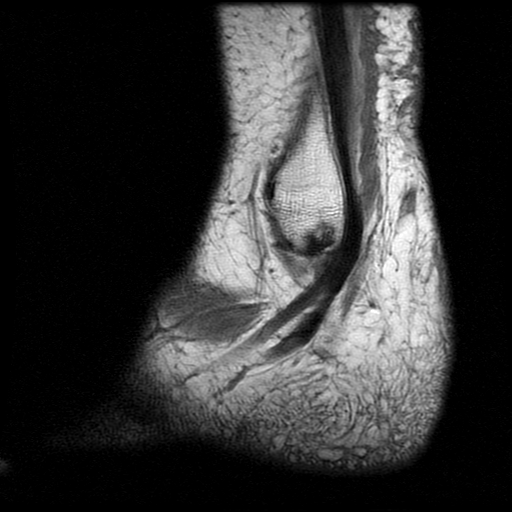
[im 16/26]
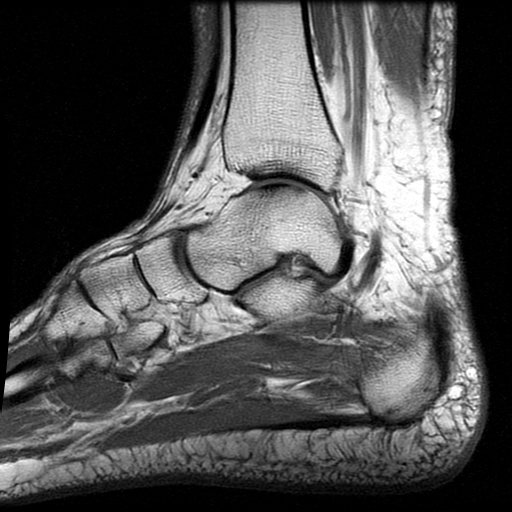
[im 26/26]
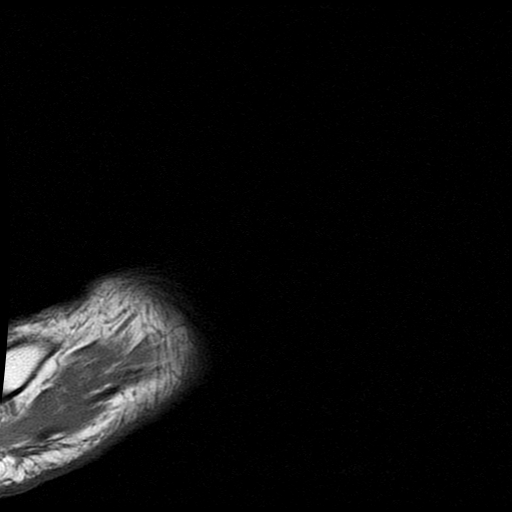

[19 of 40 positions shown; findings below may reference images not displayed]

FINDINGS: TENDONS

Peroneal: There is a small volume of fluid about the peroneal
tendons compatible with tenosynovitis without tear.

Posteromedial: Intact.

Anterior: Intact.

Achilles: The Achilles tendon is somewhat thickened with
intrasubstance increased T2 signal. A small volume of fluid is seen
in the retrocalcaneal bursa. The dorsal process of the calcaneus is
somewhat prominent.

Plantar Fascia: Unremarkable.

LIGAMENTS

Lateral: Intact.

Medial: Intact.

CARTILAGE

Ankle Joint: Unremarkable.

Subtalar Joints/Sinus Tarsi: Unremarkable.

Bones: No fracture, stress change or worrisome marrow lesion. Mild
reactive edema in the calcaneus secondary to tendinosis is
eccentrically prominent laterally.
IMPRESSION: Achilles tendinosis without tear. Changes of retrocalcaneal bursitis
are also identified. The dorsal process of the calcaneus is mildly
prominent suggestive of Haglund's deformity.

Mild peroneal tenosynovitis without tear

## 2016-10-24 DIAGNOSIS — H1011 Acute atopic conjunctivitis, right eye: Secondary | ICD-10-CM | POA: Diagnosis not present

## 2016-11-12 ENCOUNTER — Ambulatory Visit (INDEPENDENT_AMBULATORY_CARE_PROVIDER_SITE_OTHER): Payer: BLUE CROSS/BLUE SHIELD

## 2016-11-12 ENCOUNTER — Encounter: Payer: Self-pay | Admitting: Podiatry

## 2016-11-12 ENCOUNTER — Ambulatory Visit (INDEPENDENT_AMBULATORY_CARE_PROVIDER_SITE_OTHER): Payer: BLUE CROSS/BLUE SHIELD | Admitting: Podiatry

## 2016-11-12 DIAGNOSIS — M7661 Achilles tendinitis, right leg: Secondary | ICD-10-CM | POA: Diagnosis not present

## 2016-11-12 DIAGNOSIS — M722 Plantar fascial fibromatosis: Secondary | ICD-10-CM | POA: Diagnosis not present

## 2016-11-12 MED ORDER — METHYLPREDNISOLONE 4 MG PO TBPK: ORAL_TABLET | ORAL | 0 refills | Status: DC

## 2016-11-12 MED ORDER — MELOXICAM 15 MG PO TABS: 15.0000 mg | ORAL_TABLET | Freq: Every day | ORAL | 3 refills | Status: DC

## 2016-11-13 NOTE — Progress Notes (Signed)
She presents today with a several week duration of pain to the right Achilles. She states that she had the left one fixed few years back now the right one starting to bother her. She denies any trauma.  Objective: I reviewed her past medical history medications allergy surgery social history and review of systems. Vital signs are stable she is alert and oriented 3 in no apparent distress. Cutaneous evaluation does not demonstrate any type of open wounds or lesions. Pulses are strongly palpable bilateral neurologic sensorium is intact. Deep tendon reflexes are not elicitable muscle strength is normal symmetrical bilateral. Orthopedic evaluation demonstrates rectus foot type mild hallux valgus hammertoe deformities were asymptomatic large posterior nodule nonpulsatile in nature and posterior aspect of the right calcaneus not on the left previously corrected. She has fluctuance tenderness on palpation of this nodule and tenderness around the Achilles as it inserts on the bone there. Radiographs demonstrating a small retrocalcaneal proximally oriented calcaneal heel spur with mild thickening of the Achilles tendon and soft tissue increase in density at its insertion site.  Assessment: Achilles tendinitis right.  Plan: I injected the bursa and the Achilles area today making sure not to inject into the Achilles itself rather subcutaneously. 2 mg of dexamethasone and local anesthetic were utilized. placed her on steroidal anti-inflammatories to be followed by nonsteroidal she will use her plantar fascia brace at nighttime. I will follow-up with her in 1 month

## 2016-12-10 ENCOUNTER — Ambulatory Visit: Payer: BLUE CROSS/BLUE SHIELD | Admitting: Podiatry

## 2017-02-18 ENCOUNTER — Other Ambulatory Visit: Payer: Self-pay | Admitting: Family Medicine

## 2017-02-18 DIAGNOSIS — Z1231 Encounter for screening mammogram for malignant neoplasm of breast: Secondary | ICD-10-CM

## 2017-03-19 ENCOUNTER — Ambulatory Visit
Admission: RE | Admit: 2017-03-19 | Discharge: 2017-03-19 | Disposition: A | Discharge status: Home / Self Care | Payer: BLUE CROSS/BLUE SHIELD | Source: Ambulatory Visit | Attending: Family Medicine | Admitting: Family Medicine

## 2017-03-19 DIAGNOSIS — Z1231 Encounter for screening mammogram for malignant neoplasm of breast: Secondary | ICD-10-CM

## 2017-05-08 DIAGNOSIS — Z0000 Encounter for general adult medical examination without abnormal findings: Secondary | ICD-10-CM | POA: Diagnosis not present

## 2017-05-08 DIAGNOSIS — Z23 Encounter for immunization: Secondary | ICD-10-CM | POA: Diagnosis not present

## 2017-05-08 DIAGNOSIS — E039 Hypothyroidism, unspecified: Secondary | ICD-10-CM | POA: Diagnosis not present

## 2017-05-08 DIAGNOSIS — E785 Hyperlipidemia, unspecified: Secondary | ICD-10-CM | POA: Diagnosis not present

## 2017-05-08 DIAGNOSIS — E559 Vitamin D deficiency, unspecified: Secondary | ICD-10-CM | POA: Diagnosis not present

## 2017-05-19 DIAGNOSIS — E785 Hyperlipidemia, unspecified: Secondary | ICD-10-CM | POA: Diagnosis not present

## 2017-05-19 DIAGNOSIS — E039 Hypothyroidism, unspecified: Secondary | ICD-10-CM | POA: Diagnosis not present

## 2017-05-19 DIAGNOSIS — E559 Vitamin D deficiency, unspecified: Secondary | ICD-10-CM | POA: Diagnosis not present

## 2017-06-19 DIAGNOSIS — D2271 Melanocytic nevi of right lower limb, including hip: Secondary | ICD-10-CM | POA: Diagnosis not present

## 2017-06-19 DIAGNOSIS — L570 Actinic keratosis: Secondary | ICD-10-CM | POA: Diagnosis not present

## 2017-06-19 DIAGNOSIS — D224 Melanocytic nevi of scalp and neck: Secondary | ICD-10-CM | POA: Diagnosis not present

## 2017-06-19 DIAGNOSIS — Z85820 Personal history of malignant melanoma of skin: Secondary | ICD-10-CM | POA: Diagnosis not present

## 2017-06-19 DIAGNOSIS — Z86018 Personal history of other benign neoplasm: Secondary | ICD-10-CM | POA: Diagnosis not present

## 2018-01-01 DIAGNOSIS — R509 Fever, unspecified: Secondary | ICD-10-CM | POA: Diagnosis not present

## 2018-01-05 DIAGNOSIS — B349 Viral infection, unspecified: Secondary | ICD-10-CM | POA: Diagnosis not present

## 2018-02-04 DIAGNOSIS — M26609 Unspecified temporomandibular joint disorder, unspecified side: Secondary | ICD-10-CM | POA: Diagnosis not present

## 2018-02-04 DIAGNOSIS — J069 Acute upper respiratory infection, unspecified: Secondary | ICD-10-CM | POA: Diagnosis not present

## 2018-02-06 ENCOUNTER — Other Ambulatory Visit: Payer: Self-pay | Admitting: Family Medicine

## 2018-02-06 DIAGNOSIS — Z1231 Encounter for screening mammogram for malignant neoplasm of breast: Secondary | ICD-10-CM

## 2018-02-09 DIAGNOSIS — L519 Erythema multiforme, unspecified: Secondary | ICD-10-CM | POA: Diagnosis not present

## 2018-02-09 DIAGNOSIS — E559 Vitamin D deficiency, unspecified: Secondary | ICD-10-CM | POA: Diagnosis not present

## 2018-02-09 DIAGNOSIS — R5383 Other fatigue: Secondary | ICD-10-CM | POA: Diagnosis not present

## 2018-03-26 ENCOUNTER — Ambulatory Visit
Admission: RE | Admit: 2018-03-26 | Discharge: 2018-03-26 | Disposition: A | Discharge status: Home / Self Care | Payer: BLUE CROSS/BLUE SHIELD | Source: Ambulatory Visit | Attending: Family Medicine | Admitting: Family Medicine

## 2018-03-26 DIAGNOSIS — Z1231 Encounter for screening mammogram for malignant neoplasm of breast: Secondary | ICD-10-CM | POA: Diagnosis not present

## 2018-05-07 DIAGNOSIS — N9089 Other specified noninflammatory disorders of vulva and perineum: Secondary | ICD-10-CM | POA: Diagnosis not present

## 2018-05-20 DIAGNOSIS — L900 Lichen sclerosus et atrophicus: Secondary | ICD-10-CM | POA: Diagnosis not present

## 2018-08-10 DIAGNOSIS — L900 Lichen sclerosus et atrophicus: Secondary | ICD-10-CM | POA: Diagnosis not present

## 2018-08-31 DIAGNOSIS — L309 Dermatitis, unspecified: Secondary | ICD-10-CM | POA: Diagnosis not present

## 2018-11-19 DIAGNOSIS — Z86018 Personal history of other benign neoplasm: Secondary | ICD-10-CM | POA: Diagnosis not present

## 2018-11-19 DIAGNOSIS — D224 Melanocytic nevi of scalp and neck: Secondary | ICD-10-CM | POA: Diagnosis not present

## 2018-11-19 DIAGNOSIS — Z85820 Personal history of malignant melanoma of skin: Secondary | ICD-10-CM | POA: Diagnosis not present

## 2018-11-19 DIAGNOSIS — D2271 Melanocytic nevi of right lower limb, including hip: Secondary | ICD-10-CM | POA: Diagnosis not present

## 2018-12-17 DIAGNOSIS — E039 Hypothyroidism, unspecified: Secondary | ICD-10-CM | POA: Diagnosis not present

## 2018-12-17 DIAGNOSIS — Z0000 Encounter for general adult medical examination without abnormal findings: Secondary | ICD-10-CM | POA: Diagnosis not present

## 2019-02-12 DIAGNOSIS — M25511 Pain in right shoulder: Secondary | ICD-10-CM | POA: Diagnosis not present

## 2019-03-11 DIAGNOSIS — M19011 Primary osteoarthritis, right shoulder: Secondary | ICD-10-CM | POA: Diagnosis not present

## 2019-03-11 DIAGNOSIS — M25511 Pain in right shoulder: Secondary | ICD-10-CM | POA: Diagnosis not present

## 2019-05-05 ENCOUNTER — Other Ambulatory Visit: Payer: Self-pay | Admitting: Family Medicine

## 2019-05-05 DIAGNOSIS — Z1231 Encounter for screening mammogram for malignant neoplasm of breast: Secondary | ICD-10-CM

## 2019-06-17 ENCOUNTER — Ambulatory Visit: Payer: BLUE CROSS/BLUE SHIELD

## 2019-06-23 ENCOUNTER — Other Ambulatory Visit: Payer: Self-pay

## 2019-06-23 ENCOUNTER — Encounter (HOSPITAL_BASED_OUTPATIENT_CLINIC_OR_DEPARTMENT_OTHER): Payer: Self-pay | Admitting: Emergency Medicine

## 2019-06-23 ENCOUNTER — Emergency Department (HOSPITAL_BASED_OUTPATIENT_CLINIC_OR_DEPARTMENT_OTHER): Payer: BC Managed Care – PPO

## 2019-06-23 ENCOUNTER — Emergency Department (HOSPITAL_BASED_OUTPATIENT_CLINIC_OR_DEPARTMENT_OTHER)
Admission: EM | Admit: 2019-06-23 | Discharge: 2019-06-23 | Disposition: A | Discharge status: Home / Self Care | Payer: BC Managed Care – PPO | Attending: Emergency Medicine | Admitting: Emergency Medicine

## 2019-06-23 DIAGNOSIS — Y9301 Activity, walking, marching and hiking: Secondary | ICD-10-CM | POA: Insufficient documentation

## 2019-06-23 DIAGNOSIS — Y929 Unspecified place or not applicable: Secondary | ICD-10-CM | POA: Insufficient documentation

## 2019-06-23 DIAGNOSIS — Y999 Unspecified external cause status: Secondary | ICD-10-CM | POA: Insufficient documentation

## 2019-06-23 DIAGNOSIS — W010XXA Fall on same level from slipping, tripping and stumbling without subsequent striking against object, initial encounter: Secondary | ICD-10-CM | POA: Diagnosis not present

## 2019-06-23 DIAGNOSIS — S52572A Other intraarticular fracture of lower end of left radius, initial encounter for closed fracture: Secondary | ICD-10-CM | POA: Diagnosis not present

## 2019-06-23 DIAGNOSIS — E039 Hypothyroidism, unspecified: Secondary | ICD-10-CM | POA: Diagnosis not present

## 2019-06-23 DIAGNOSIS — Z79899 Other long term (current) drug therapy: Secondary | ICD-10-CM | POA: Diagnosis not present

## 2019-06-23 DIAGNOSIS — S6992XA Unspecified injury of left wrist, hand and finger(s), initial encounter: Secondary | ICD-10-CM | POA: Diagnosis not present

## 2019-06-23 DIAGNOSIS — S52502A Unspecified fracture of the lower end of left radius, initial encounter for closed fracture: Secondary | ICD-10-CM | POA: Diagnosis not present

## 2019-06-23 DIAGNOSIS — S59902A Unspecified injury of left elbow, initial encounter: Secondary | ICD-10-CM | POA: Diagnosis not present

## 2019-06-23 MED ORDER — ACETAMINOPHEN 500 MG PO TABS
1000.0000 mg | ORAL_TABLET | Freq: Once | ORAL | Status: AC
  Administered 2019-06-23: 1000 mg via ORAL
  Filled 2019-06-23: qty 2

## 2019-06-23 MED ORDER — IBUPROFEN 400 MG PO TABS
400.0000 mg | ORAL_TABLET | Freq: Once | ORAL | Status: AC
  Administered 2019-06-23: 400 mg via ORAL
  Filled 2019-06-23: qty 1

## 2019-06-23 MED ORDER — HYDROCODONE-ACETAMINOPHEN 5-325 MG PO TABS: 1.0000 | ORAL_TABLET | ORAL | 0 refills | Status: DC | PRN

## 2019-06-23 MED FILL — HYDROCODON-APAP 5-325: 5-325 | 2 days supply | Qty: 10 | Fill #0

## 2019-06-23 NOTE — ED Triage Notes (Signed)
Tripped over a rock this morning, injuring left wrist. Fingers numb.  Some swelling but no obvious deformity.  Skin is intact.

## 2019-06-23 NOTE — ED Notes (Signed)
Transported to radiology 

## 2019-06-23 NOTE — Discharge Instructions (Addendum)
While you are here, we took x-rays of your elbow and wrist.  These x-rays show that you have a fracture of the distal radius.  This is not an emergency.  We will immobilize your wrist and have you follow-up with [facility].  Please call to schedule an appointment in the next several days.  For pain control, when you get home you can continue to use Tylenol and ibuprofen as needed.  It may be helpful to use Tylenol 1000 mg with Motrin 400 mg every 6-8 hours.  You do not have to stick to that regimen.  We have also provided 10 pills of Norco for you to use for severe pain.  Your discomfort may increase in the next day or 2 before improving.  Please be seen in the ED if you begin to experience new numbness of your hand or significantly worsened pain with swelling around her wrist.

## 2019-06-23 NOTE — ED Provider Notes (Signed)
[facility] EMERGENCY DEPARTMENT Provider Note   CSN: [id number] Arrival date & time: 06/23/19  5573     History Chief Complaint  Patient presents with  . Fall    Kathy Fernandez is a 69 y.o. female.  [name] is a 69 year old woman who presents to the emergency room after a fall on outstretched hand.  She reports that she was taking a walk this morning when she tripped over a rock and fell on her left hand and left knee.  She did not hit her head, she did not lose consciousness, she is able to stand up immediately and walk away.  She is not concerned about her knee at all and has only a mild scrape there.  She is most concerned about her left wrist which has been incredibly painful since her fall.  She reports that she has had some tingling in her left hand and significant pain around her wrist since the fall.  She is not on blood thinners.  She has not had a broken bone before.  She reports that her past DEXA scans have been normal.        Past Medical History:  Diagnosis Date  . Achilles tendon tear   . Hypothyroidism   . Polymyalgia rheumatica ([place])   . PONV (postoperative nausea and vomiting)   . Sleep apnea     Patient Active Problem List   Diagnosis Date Noted  . Chest pain 05/16/2016  . DOE (dyspnea on exertion) 05/16/2016  . Irregular heart beat 05/16/2016  . Adult hypothyroidism 05/17/2014  . Arthritis, degenerative 05/17/2014  . 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 Restless leg 05/17/2014  . 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 OBSTRUCTIVE SLEEP APNEA 10/27/2008  . RESTLESS LEGS SYNDROME 10/27/2008    Past Surgical History:  Procedure Laterality Date  . ABDOMINAL HYSTERECTOMY  2005  . BREAST BIOPSY Right    benign  . CHOLECYSTECTOMY  2007  . DIAGNOSTIC LAPAROSCOPY       OB History   No obstetric history on file.     Family History  Problem Relation Age of Onset  . Hypothyroidism Mother   . Diabetes Mother   .  CVA Mother   . Gout Father   . Skin cancer Father   . Hypertension Father   . Heart failure Father   . Rashes / Skin problems Sister   . Polycystic kidney disease Daughter   . Bipolar disorder Son   . Breast cancer Neg Hx     Social History   Tobacco Use  . Smoking status: Never Smoker  . Smokeless tobacco: Never Used  Substance Use Topics  . Alcohol use: No    Alcohol/week: 0.0 standard drinks  . Drug use: No    Home Medications Prior to Admission medications   Medication Sig Start Date End Date Taking? Authorizing Provider  FLUoxetine (PROZAC) 20 MG capsule Take 20 mg by mouth daily.    [provider]  levothyroxine (SYNTHROID, LEVOTHROID) 50 MCG tablet Take 50 mcg by mouth daily before breakfast.    [provider]  pramipexole (MIRAPEX) 0.5 MG tablet Take 0.5 mg by mouth 3 (three) times daily.    [provider]    Allergies    Penicillins, Codeine, Neomycin-bacitracin zn-polymyx, and Neosporin  [neomycin-polymyxin-gramicidin]  Review of Systems   Review of Systems  All other systems reviewed and are negative.   Physical Exam Updated Vital Signs BP (!) 115/56 (BP Location: Right Arm)  Pulse 80   Temp 98.1 F (36.7 C) (Oral)   Resp 20   Ht 5\' 7"  (1.702 m)   Wt 122.5 kg   SpO2 100%   BMI 42.29 kg/m   Physical Exam Constitutional:      General: She is not in acute distress.    Appearance: Normal appearance. She is obese. She is not ill-appearing.  HENT:     Head: Normocephalic and atraumatic.     Nose: Nose normal.     Mouth/Throat:     Mouth: Mucous membranes are moist.     Pharynx: Oropharynx is clear.  Eyes:     Extraocular Movements: Extraocular movements intact.     Conjunctiva/sclera: Conjunctivae normal.     Pupils: Pupils are equal, round, and reactive to light.  Cardiovascular:     Rate and Rhythm: Normal rate and regular rhythm.     Heart sounds: Normal heart sounds.  Pulmonary:     Effort: Pulmonary effort  is normal.     Breath sounds: Normal breath sounds.  Abdominal:     General: Abdomen is flat.  Musculoskeletal:        General: Swelling, tenderness and signs of injury present. No deformity.     Cervical back: Normal range of motion and neck supple.     Comments: Left knee: No gross abnormalities.  Mild excoriations present.  No broken skin, no significant bruising.  No tenderness to palpation.  Normal range of motion. Left shoulder.  No gross abnormalities, no skin changes, abrasions.  No tenderness to palpation.  Normal range of motion. Left wrist: Inspection: Mildly swollen around the radial aspect of the wrist.  No other deformities appreciated. Palpation: Significantly tender to palpation primarily around the radial wrist.  Some tenderness to palpation of the left forearm that the tenderness localizes to the radial wrist. ROM: Not able to fully flex or extend fingers actively.  Some pain with passive flexion of the first and second fingers. Neurovascularly intact.  Neurological:     Mental Status: She is alert.     ED Results / Procedures / Treatments   Labs (all labs ordered are listed, but only abnormal results are displayed) Labs Reviewed - No data to display  EKG None  Radiology No results found.  Procedures Procedures (including critical care time)  Medications Ordered in ED Medications - No data to display  ED Course  I have reviewed the triage vital signs and the nursing notes.  Pertinent labs & imaging results that were available during my care of the patient were reviewed by me and considered in my medical decision making (see chart for details).    MDM Rules/Calculators/A&P                      [name] is a 69 year old woman who presents to the ED after a fall on outstretched hand this morning.  History and physical exam are concerning for a wrist/forearm fracture.  Will begin work-up with wrist and elbow imaging.  Wrist x-ray shows a minimally  displaced distal radius fracture.  She was splinted with a radial gutter splint and told to follow-up with orthopedics within the week.  She is encouraged to use Tylenol and Motrin for pain control and given a short course of hydrocodone.  Final Clinical Impression(s) / ED Diagnoses Final diagnoses:  None    Rx / DC Orders ED Discharge Orders    None       79, MD  06/23/19 1539    Gwyneth Sprout, MD 06/23/19 1540

## 2019-06-30 ENCOUNTER — Other Ambulatory Visit: Payer: Self-pay

## 2019-06-30 ENCOUNTER — Encounter (HOSPITAL_BASED_OUTPATIENT_CLINIC_OR_DEPARTMENT_OTHER): Payer: Self-pay | Admitting: Orthopaedic Surgery

## 2019-06-30 DIAGNOSIS — M25532 Pain in left wrist: Secondary | ICD-10-CM | POA: Diagnosis not present

## 2019-06-30 DIAGNOSIS — S52532A Colles' fracture of left radius, initial encounter for closed fracture: Secondary | ICD-10-CM | POA: Diagnosis not present

## 2019-07-01 ENCOUNTER — Encounter (HOSPITAL_BASED_OUTPATIENT_CLINIC_OR_DEPARTMENT_OTHER)
Admission: RE | Admit: 2019-07-01 | Discharge: 2019-07-01 | Disposition: A | Discharge status: Home / Self Care | Payer: BC Managed Care – PPO | Source: Ambulatory Visit | Attending: Orthopaedic Surgery | Admitting: Orthopaedic Surgery

## 2019-07-01 ENCOUNTER — Ambulatory Visit
Admission: RE | Admit: 2019-07-01 | Discharge: 2019-07-01 | Disposition: A | Discharge status: Home / Self Care | Payer: BC Managed Care – PPO | Source: Ambulatory Visit | Attending: Family Medicine | Admitting: Family Medicine

## 2019-07-01 ENCOUNTER — Other Ambulatory Visit (HOSPITAL_COMMUNITY)
Admission: RE | Admit: 2019-07-01 | Discharge: 2019-07-01 | Disposition: A | Discharge status: Home / Self Care | Payer: BC Managed Care – PPO | Source: Ambulatory Visit | Attending: Orthopaedic Surgery | Admitting: Orthopaedic Surgery

## 2019-07-01 DIAGNOSIS — Z1231 Encounter for screening mammogram for malignant neoplasm of breast: Secondary | ICD-10-CM | POA: Diagnosis not present

## 2019-07-01 DIAGNOSIS — Z01812 Encounter for preprocedural laboratory examination: Secondary | ICD-10-CM | POA: Diagnosis not present

## 2019-07-01 DIAGNOSIS — Z20822 Contact with and (suspected) exposure to covid-19: Secondary | ICD-10-CM | POA: Diagnosis not present

## 2019-07-01 LAB — SURGICAL PCR SCREEN
MRSA, PCR: NEGATIVE
Staphylococcus aureus: NEGATIVE

## 2019-07-01 LAB — SARS CORONAVIRUS 2 (TAT 6-24 HRS): SARS Coronavirus 2: NEGATIVE

## 2019-07-01 NOTE — Progress Notes (Signed)
Notified pt to come pick up soap and drink pt verbalized understanding, pt states will come today.

## 2019-07-01 NOTE — Progress Notes (Signed)

## 2019-07-05 ENCOUNTER — Ambulatory Visit (HOSPITAL_BASED_OUTPATIENT_CLINIC_OR_DEPARTMENT_OTHER): Payer: BC Managed Care – PPO | Admitting: Certified Registered"

## 2019-07-05 ENCOUNTER — Encounter (HOSPITAL_BASED_OUTPATIENT_CLINIC_OR_DEPARTMENT_OTHER): Payer: Self-pay | Admitting: Orthopaedic Surgery

## 2019-07-05 ENCOUNTER — Encounter (HOSPITAL_BASED_OUTPATIENT_CLINIC_OR_DEPARTMENT_OTHER)
Admission: RE | Disposition: A | Discharge status: Home / Self Care | Payer: Self-pay | Source: Home / Self Care | Attending: Orthopaedic Surgery

## 2019-07-05 ENCOUNTER — Other Ambulatory Visit: Payer: Self-pay

## 2019-07-05 ENCOUNTER — Ambulatory Visit (HOSPITAL_BASED_OUTPATIENT_CLINIC_OR_DEPARTMENT_OTHER)
Admission: RE | Admit: 2019-07-05 | Discharge: 2019-07-05 | Disposition: A | Discharge status: Home / Self Care | Payer: BC Managed Care – PPO | Attending: Orthopaedic Surgery | Admitting: Orthopaedic Surgery

## 2019-07-05 DIAGNOSIS — F418 Other specified anxiety disorders: Secondary | ICD-10-CM | POA: Diagnosis not present

## 2019-07-05 DIAGNOSIS — E039 Hypothyroidism, unspecified: Secondary | ICD-10-CM | POA: Insufficient documentation

## 2019-07-05 DIAGNOSIS — Z79890 Hormone replacement therapy (postmenopausal): Secondary | ICD-10-CM | POA: Insufficient documentation

## 2019-07-05 DIAGNOSIS — Z881 Allergy status to other antibiotic agents status: Secondary | ICD-10-CM | POA: Insufficient documentation

## 2019-07-05 DIAGNOSIS — F329 Major depressive disorder, single episode, unspecified: Secondary | ICD-10-CM | POA: Diagnosis not present

## 2019-07-05 DIAGNOSIS — G4730 Sleep apnea, unspecified: Secondary | ICD-10-CM | POA: Insufficient documentation

## 2019-07-05 DIAGNOSIS — W010XXA Fall on same level from slipping, tripping and stumbling without subsequent striking against object, initial encounter: Secondary | ICD-10-CM | POA: Diagnosis not present

## 2019-07-05 DIAGNOSIS — Z79899 Other long term (current) drug therapy: Secondary | ICD-10-CM | POA: Insufficient documentation

## 2019-07-05 DIAGNOSIS — Z8349 Family history of other endocrine, nutritional and metabolic diseases: Secondary | ICD-10-CM | POA: Diagnosis not present

## 2019-07-05 DIAGNOSIS — S52572A Other intraarticular fracture of lower end of left radius, initial encounter for closed fracture: Secondary | ICD-10-CM | POA: Diagnosis not present

## 2019-07-05 DIAGNOSIS — F419 Anxiety disorder, unspecified: Secondary | ICD-10-CM | POA: Diagnosis not present

## 2019-07-05 DIAGNOSIS — Z885 Allergy status to narcotic agent status: Secondary | ICD-10-CM | POA: Insufficient documentation

## 2019-07-05 DIAGNOSIS — G4733 Obstructive sleep apnea (adult) (pediatric): Secondary | ICD-10-CM | POA: Diagnosis not present

## 2019-07-05 DIAGNOSIS — Z880 Allergy status to penicillin: Secondary | ICD-10-CM | POA: Diagnosis not present

## 2019-07-05 DIAGNOSIS — M353 Polymyalgia rheumatica: Secondary | ICD-10-CM | POA: Diagnosis not present

## 2019-07-05 HISTORY — DX: Depression, unspecified: F32.A

## 2019-07-05 HISTORY — PX: ORIF WRIST FRACTURE: SHX2133

## 2019-07-05 HISTORY — DX: Anxiety disorder, unspecified: F41.9

## 2019-07-05 SURGERY — OPEN REDUCTION INTERNAL FIXATION (ORIF) WRIST FRACTURE
Anesthesia: Monitor Anesthesia Care | Site: Wrist | Laterality: Left

## 2019-07-05 MED ORDER — FENTANYL CITRATE (PF) 100 MCG/2ML IJ SOLN
INTRAMUSCULAR | Status: DC | PRN
  Administered 2019-07-05: 25 ug via INTRAVENOUS

## 2019-07-05 MED ORDER — DEXMEDETOMIDINE HCL IN NACL 200 MCG/50ML IV SOLN
INTRAVENOUS | Status: AC
  Filled 2019-07-05: qty 50

## 2019-07-05 MED ORDER — CHLORHEXIDINE GLUCONATE 4 % EX LIQD: 60.0000 mL | Freq: Once | CUTANEOUS | Status: DC

## 2019-07-05 MED ORDER — SCOPOLAMINE 1 MG/3DAYS TD PT72
MEDICATED_PATCH | TRANSDERMAL | Status: AC
  Filled 2019-07-05: qty 1

## 2019-07-05 MED ORDER — PROPOFOL 500 MG/50ML IV EMUL
INTRAVENOUS | Status: AC
  Filled 2019-07-05: qty 50

## 2019-07-05 MED ORDER — OXYCODONE HCL 5 MG/5ML PO SOLN: 5.0000 mg | Freq: Once | ORAL | Status: AC | PRN

## 2019-07-05 MED ORDER — VANCOMYCIN HCL 1500 MG/300ML IV SOLN
1500.0000 mg | INTRAVENOUS | Status: AC
  Administered 2019-07-05: 1500 mg via INTRAVENOUS

## 2019-07-05 MED ORDER — PROPOFOL 10 MG/ML IV BOLUS
INTRAVENOUS | Status: DC | PRN
  Administered 2019-07-05 (×2): 25 mg via INTRAVENOUS

## 2019-07-05 MED ORDER — PROPOFOL 10 MG/ML IV BOLUS
INTRAVENOUS | Status: AC
  Filled 2019-07-05: qty 20

## 2019-07-05 MED ORDER — OXYCODONE HCL 5 MG PO TABS
5.0000 mg | ORAL_TABLET | Freq: Once | ORAL | Status: AC | PRN
  Administered 2019-07-05: 5 mg via ORAL

## 2019-07-05 MED ORDER — ACETAMINOPHEN 500 MG PO TABS
1000.0000 mg | ORAL_TABLET | Freq: Once | ORAL | Status: AC
  Administered 2019-07-05: 1000 mg via ORAL

## 2019-07-05 MED ORDER — VANCOMYCIN HCL IN DEXTROSE 500-5 MG/100ML-% IV SOLN
INTRAVENOUS | Status: AC
  Filled 2019-07-05: qty 100

## 2019-07-05 MED ORDER — POVIDONE-IODINE 10 % EX SWAB
2.0000 "application " | Freq: Once | CUTANEOUS | Status: AC
  Administered 2019-07-05: 2 via TOPICAL

## 2019-07-05 MED ORDER — SCOPOLAMINE 1 MG/3DAYS TD PT72
1.0000 | MEDICATED_PATCH | TRANSDERMAL | Status: DC
  Administered 2019-07-05: 1.5 mg via TRANSDERMAL

## 2019-07-05 MED ORDER — ONDANSETRON HCL 4 MG/2ML IJ SOLN
INTRAMUSCULAR | Status: AC
  Filled 2019-07-05: qty 6

## 2019-07-05 MED ORDER — OXYCODONE HCL 5 MG PO TABS
ORAL_TABLET | ORAL | Status: AC
  Filled 2019-07-05: qty 1

## 2019-07-05 MED ORDER — PROMETHAZINE HCL 25 MG/ML IJ SOLN: 6.2500 mg | INTRAMUSCULAR | Status: DC | PRN

## 2019-07-05 MED ORDER — ACETAMINOPHEN 500 MG PO TABS
ORAL_TABLET | ORAL | Status: AC
  Filled 2019-07-05: qty 2

## 2019-07-05 MED ORDER — ROPIVACAINE HCL 5 MG/ML IJ SOLN
INTRAMUSCULAR | Status: DC | PRN
  Administered 2019-07-05: 30 mL via PERINEURAL

## 2019-07-05 MED ORDER — HYDROMORPHONE HCL 1 MG/ML IJ SOLN: 0.2500 mg | INTRAMUSCULAR | Status: DC | PRN

## 2019-07-05 MED ORDER — FENTANYL CITRATE (PF) 100 MCG/2ML IJ SOLN
INTRAMUSCULAR | Status: AC
  Filled 2019-07-05: qty 2

## 2019-07-05 MED ORDER — LIDOCAINE HCL (PF) 0.5 % IJ SOLN
INTRAMUSCULAR | Status: AC
  Filled 2019-07-05: qty 100

## 2019-07-05 MED ORDER — DEXAMETHASONE SODIUM PHOSPHATE 10 MG/ML IJ SOLN
INTRAMUSCULAR | Status: DC | PRN
  Administered 2019-07-05: 10 mg

## 2019-07-05 MED ORDER — FENTANYL CITRATE (PF) 100 MCG/2ML IJ SOLN
50.0000 ug | INTRAMUSCULAR | Status: DC | PRN
  Administered 2019-07-05: 50 ug via INTRAVENOUS

## 2019-07-05 MED ORDER — 0.9 % SODIUM CHLORIDE (POUR BTL) OPTIME
TOPICAL | Status: DC | PRN
  Administered 2019-07-05: 1000 mL

## 2019-07-05 MED ORDER — HYDROCODONE-ACETAMINOPHEN 5-325 MG PO TABS: 1.0000 | ORAL_TABLET | Freq: Four times a day (QID) | ORAL | 0 refills | Status: AC | PRN

## 2019-07-05 MED ORDER — KETOROLAC TROMETHAMINE 30 MG/ML IJ SOLN: 30.0000 mg | Freq: Once | INTRAMUSCULAR | Status: DC | PRN

## 2019-07-05 MED ORDER — LACTATED RINGERS IV SOLN: INTRAVENOUS | Status: DC

## 2019-07-05 MED ORDER — ONDANSETRON HCL 4 MG/2ML IJ SOLN
INTRAMUSCULAR | Status: DC | PRN
  Administered 2019-07-05: 4 mg via INTRAVENOUS

## 2019-07-05 MED ORDER — MIDAZOLAM HCL 2 MG/2ML IJ SOLN
INTRAMUSCULAR | Status: AC
  Filled 2019-07-05: qty 2

## 2019-07-05 MED ORDER — VANCOMYCIN HCL IN DEXTROSE 1-5 GM/200ML-% IV SOLN
INTRAVENOUS | Status: AC
  Filled 2019-07-05: qty 200

## 2019-07-05 MED ORDER — PROPOFOL 500 MG/50ML IV EMUL
INTRAVENOUS | Status: DC | PRN
  Administered 2019-07-05: 100 ug/kg/min via INTRAVENOUS
  Administered 2019-07-05: 125 ug/kg/min via INTRAVENOUS

## 2019-07-05 MED ORDER — MIDAZOLAM HCL 2 MG/2ML IJ SOLN
1.0000 mg | INTRAMUSCULAR | Status: DC | PRN
  Administered 2019-07-05: 2 mg via INTRAVENOUS

## 2019-07-05 SURGICAL SUPPLY — 81 items
BLADE CLIPPER SURG (BLADE) IMPLANT
BLADE SURG 15 STRL LF DISP TIS (BLADE) ×2 IMPLANT
BLADE SURG 15 STRL SS (BLADE) ×2
BNDG CONFORM 2 STRL LF (GAUZE/BANDAGES/DRESSINGS) IMPLANT
BNDG ELASTIC 3X5.8 VLCR STR LF (GAUZE/BANDAGES/DRESSINGS) ×2 IMPLANT
BNDG ELASTIC 4X5.8 VLCR STR LF (GAUZE/BANDAGES/DRESSINGS) ×2 IMPLANT
BNDG ESMARK 4X9 LF (GAUZE/BANDAGES/DRESSINGS) ×2 IMPLANT
BNDG GAUZE ELAST 4 BULKY (GAUZE/BANDAGES/DRESSINGS) ×1 IMPLANT
BRUSH SCRUB EZ PLAIN DRY (MISCELLANEOUS) ×2 IMPLANT
CANISTER SUCT 1200ML W/VALVE (MISCELLANEOUS) ×2 IMPLANT
CORD BIPOLAR FORCEPS 12FT (ELECTRODE) ×2 IMPLANT
COVER BACK TABLE 60X90IN (DRAPES) ×2 IMPLANT
COVER WAND RF STERILE (DRAPES) IMPLANT
CUFF TOURN SGL QUICK 18X4 (TOURNIQUET CUFF) IMPLANT
DECANTER SPIKE VIAL GLASS SM (MISCELLANEOUS) IMPLANT
DRAPE EXTREMITY T 121X128X90 (DISPOSABLE) ×2 IMPLANT
DRAPE OEC MINIVIEW 54X84 (DRAPES) ×2 IMPLANT
DRAPE SURG 17X23 STRL (DRAPES) ×2 IMPLANT
DRSG ADAPTIC 3X8 NADH LF (GAUZE/BANDAGES/DRESSINGS) ×2 IMPLANT
DRSG EMULSION OIL 3X3 NADH (GAUZE/BANDAGES/DRESSINGS) ×2 IMPLANT
GAUZE 4X4 16PLY RFD (DISPOSABLE) IMPLANT
GAUZE SPONGE 4X4 12PLY STRL (GAUZE/BANDAGES/DRESSINGS) ×2 IMPLANT
GAUZE XEROFORM 1X8 LF (GAUZE/BANDAGES/DRESSINGS) ×1 IMPLANT
GAUZE XEROFORM 5X9 LF (GAUZE/BANDAGES/DRESSINGS) ×2 IMPLANT
GLOVE BIOGEL PI IND STRL 8 (GLOVE) ×1 IMPLANT
GLOVE BIOGEL PI INDICATOR 8 (GLOVE) ×1
GLOVE SURG SYN 7.5  E (GLOVE)
GLOVE SURG SYN 7.5 E (GLOVE) IMPLANT
GLOVE SURG SYN 7.5 PF PI (GLOVE) IMPLANT
GOWN STRL REUS W/ TWL LRG LVL3 (GOWN DISPOSABLE) ×1 IMPLANT
GOWN STRL REUS W/ TWL XL LVL3 (GOWN DISPOSABLE) ×1 IMPLANT
GOWN STRL REUS W/TWL LRG LVL3 (GOWN DISPOSABLE) ×1
GOWN STRL REUS W/TWL XL LVL3 (GOWN DISPOSABLE) ×1
GUIDEWIRE ORTHO 0.054X6 (WIRE) ×2 IMPLANT
NDL HYPO 25X1 1.5 SAFETY (NEEDLE) ×1 IMPLANT
NEEDLE HYPO 22GX1.5 SAFETY (NEEDLE) IMPLANT
NEEDLE HYPO 25X1 1.5 SAFETY (NEEDLE) ×2 IMPLANT
NS IRRIG 1000ML POUR BTL (IV SOLUTION) ×2 IMPLANT
PACK BASIN DAY SURGERY FS (CUSTOM PROCEDURE TRAY) ×2 IMPLANT
PAD CAST 3X4 CTTN HI CHSV (CAST SUPPLIES) ×2 IMPLANT
PAD CAST 4YDX4 CTTN HI CHSV (CAST SUPPLIES) ×1 IMPLANT
PADDING CAST ABS 3INX4YD NS (CAST SUPPLIES) ×1
PADDING CAST ABS 4INX4YD NS (CAST SUPPLIES) ×1
PADDING CAST ABS COTTON 3X4 (CAST SUPPLIES) ×1 IMPLANT
PADDING CAST ABS COTTON 4X4 ST (CAST SUPPLIES) ×1 IMPLANT
PADDING CAST COTTON 3X4 STRL (CAST SUPPLIES) ×2
PADDING CAST COTTON 4X4 STRL (CAST SUPPLIES) ×1
PADDING CAST COTTON 6X4 STRL (CAST SUPPLIES) ×1 IMPLANT
PLATE ACULOC 2 VDR STD LT (Plate) ×1 IMPLANT
SCREW BN FT 16X2.3XLCK HEX CRT (Screw) IMPLANT
SCREW CORT 24X2.3XNONTOGGLE (Screw) IMPLANT
SCREW CORT FT 18X2.3XLCK HEX (Screw) IMPLANT
SCREW CORT FT 20X2.3XLCK HEX (Screw) IMPLANT
SCREW CORTICAL 2.3X24 (Screw) ×1 IMPLANT
SCREW CORTICAL LOCKING 2.3X14M (Screw) ×1 IMPLANT
SCREW CORTICAL LOCKING 2.3X16M (Screw) ×1 IMPLANT
SCREW CORTICAL LOCKING 2.3X18M (Screw) ×5 IMPLANT
SCREW CORTICAL LOCKING 2.3X20M (Screw) ×1 IMPLANT
SCREW LOCK 12X3.5X HEXALOBE (Screw) IMPLANT
SCREW LOCKING 3.5X10MM (Screw) ×1 IMPLANT
SCREW LOCKING 3.5X12 (Screw) ×1 IMPLANT
SCREW NONLOCK HEX 3.5X12 (Screw) ×1 IMPLANT
SHEET MEDIUM DRAPE 40X70 STRL (DRAPES) ×2 IMPLANT
SPLINT FIBERGLASS 3X35 (CAST SUPPLIES) ×1 IMPLANT
SPLINT FIBERGLASS 4X30 (CAST SUPPLIES) IMPLANT
SPLINT PLASTER CAST XFAST 3X15 (CAST SUPPLIES) IMPLANT
SPLINT PLASTER XTRA FASTSET 3X (CAST SUPPLIES)
STOCKINETTE SYNTHETIC 3 UNSTER (CAST SUPPLIES) ×2 IMPLANT
SUCTION FRAZIER HANDLE 10FR (MISCELLANEOUS) ×1
SUCTION TUBE FRAZIER 10FR DISP (MISCELLANEOUS) ×1 IMPLANT
SUT ETHIBOND 3-0 V-5 (SUTURE) IMPLANT
SUT PROLENE 4 0 PS 2 18 (SUTURE) ×3 IMPLANT
SUT VIC AB 3-0 FS2 27 (SUTURE) ×2 IMPLANT
SYR BULB 3OZ (MISCELLANEOUS) ×2 IMPLANT
SYR CONTROL 10ML LL (SYRINGE) IMPLANT
SYSTEM CHEST DRAIN TLS 7FR (DRAIN) ×2 IMPLANT
TAPE SURG TRANSPORE 1 IN (GAUZE/BANDAGES/DRESSINGS) ×1 IMPLANT
TAPE SURGICAL TRANSPORE 1 IN (GAUZE/BANDAGES/DRESSINGS) ×1
TOWEL GREEN STERILE FF (TOWEL DISPOSABLE) ×4 IMPLANT
TUBE CONNECTING 20X1/4 (TUBING) ×2 IMPLANT
UNDERPAD 30X36 HEAVY ABSORB (UNDERPADS AND DIAPERS) ×2 IMPLANT

## 2019-07-05 NOTE — Anesthesia Procedure Notes (Signed)
Date/Time: 07/05/2019 9:30 AM Performed by: Elliot Dally, CRNA Pre-anesthesia Checklist: Patient identified, Emergency Drugs available, Suction available, Patient being monitored and Timeout performed Patient Re-evaluated:Patient Re-evaluated prior to induction Oxygen Delivery Method: Simple face mask Ventilation: Nasal airway inserted- appropriate to patient size

## 2019-07-05 NOTE — Op Note (Signed)
PREOPERATIVE DIAGNOSIS: Left three-part intra-articular distal radius fracture  POSTOPERATIVE DIAGNOSIS: Same  ATTENDING PHYSICIAN: Maudry Mayhew. Jeannie Fend, III, MD who was present and scrubbed for the entire case   ASSISTANT SURGEON: None.   ANESTHESIA: Regional with MAC  SURGICAL PROCEDURES: Open reduction and internal fixation of left 3 part intra-articular distal radius fracture  SURGICAL INDICATIONS: Patient is a 69 year old female who approximately week and a half ago tripped over a [place] fell onto an outstretched left hand.  She had immediate pain and swelling to the wrist was initially seen at outside facility.  She was found to have a intra-articular left distal radius fracture.  She was placed into a splint and then sent to see me in clinic.  In clinic, repeat radiographs showed continued dorsal angulation of the articular surface as well as diastases of the intra-articular split between the lunate fossa and scaphoid fossa.  We discussed both operative and nonoperative treatment measures including the risk and benefits of both.  My concern with continued nonoperative management included concern for continued loss of articular alignment as well as persistent diastases of the joint.  After having this discussion she did wish to proceed forward with surgical fixation of her left wrist.  FINDING: Successful open reduction and internal fixation was achieved utilizing a volar locking plate and screw construct.  Near anatomic alignment of the three-part intra-articular distal radius fracture was achieved.  Correction of the dorsal angulation as well as the intra-articular diastases was achieved.  DESCRIPTION OF PROCEDURE: The patient was identified in the preop holding area where the risk benefits and alternatives of the procedure were once again discussed the patient.  These include but not limited to infection, bleeding, damage to surrounding structures including blood vessels and nerves, pain,  stiffness, malunion, nonunion, implant failure and need for additional procedures.  Informed consent was obtained at that time the patient's left wrist was marked with a surgical arcing pen.  She then underwent left upper extremity plexus block by anesthesia.  She was brought to the operative suite where timeout was performed identifying the correct patient operative site.  She was positioned supine on the operative table with her hand outstretched on a hand table.  A tourniquet was placed on the upper arm.  She was given preoperative antibiotics.  The left upper extremity was then prepped and draped in usual sterile fashion.  The limb was exsanguinated and the tourniquet was inflated.  A longitudinal incision was made in the volar aspect of the distal forearm.  A standard FCR approach was then utilized with a longitudinal incision over the FCR tendon sheath.  The tendon was mobilized ulnarly and the deep sheath was also incised.  The FPL tendon was then bluntly dissected and retracted ulnarly.  The pronator quadratus was then visualized and incised and gently elevated off the volar, distal radius.  At this point the distal radius fracture was visualized easily.  There was a transverse fracture as well as a longitudinal split which did extend up into the articular surface.  This was the gap between the scaphoid and lunate fossa's.  The fracture was manipulated using a Soil scientist within the fracture lines elevating up the cortical surface.  Additionally longitudinal traction as well as volar translation of the hand and carpus provided in near-anatomic reduction.  A K wire was placed within the radial styloid crossing the fracture to help hold provisional fixation.  At this point an Acumed, distally fitting, standard width and length volar locking plate was  placed along the volar aspect of the distal radius.  It was pinned in the place and confirmed to be in appropriate position on both the AP and lateral  fluoroscopic images.  At this point, a cortical screw was placed into the distal row of the plate reducing the plate down to the volar surface of the bone.  Multiple locking screws were then placed throughout the distal row of the plate.  At this point a nonlocking cortical cortical screw was placed in the oblong hole in the plate proximally.  This reduced the plate down to the bone and using a kickstand technique help to correct the residual, mild dorsal angulation.  2 additional locking screws were placed within the proximal portion of the plate.  At this point the cortical screw placed distally we was exchanged for a locking screw and multiple, additional locking screws were placed within the distal portion of the plate.  This is provided with excellent fixation of the intra-articular distal radius fracture.  Multiple fluoroscopic images were obtained which showed appropriate positioning of the plate, adequate and appropriate screw lengths as well as near-anatomic alignment of the fracture.  The wound was then copiously irrigated with normal saline.  The skin was closed with interrupted 4-0 Prolene sutures.  The DRUJ was stressed in both pronation and supination and found to be stable.  Xeroform, 4 x 4's and a well-padded volar slab splint were then placed.  The tourniquet was released and the patient had return of brisk capillary refill to all of her digits.  She tolerated the procedure well and there were no complications.  She was taken to the PACU in stable condition.  RADIOGRAPHIC INTERPRETATION: AP, lateral and fossa lateral images were taken intraoperative under fluoroscopic images.  These show near-anatomic alignment of the previous, displaced three-part intra-articular distal radius fracture.  Volar locking plate and screw construct is in place and in appropriate position.  All screw lengths are appropriate and extra-articular.  ESTIMATED BLOOD LOSS: 10 mL  TOURNIQUET TIME: Approximately 35  minutes  SPECIMENS: None  POSTOPERATIVE PLAN: The patient will be discharged home and seen back  in the office in approximately 10-12 days for wound check, suture  removal, and then be sent to a therapist for early range of motion of her wrist, custom-made volar slab splint, edema control.  She can start light strengthening of the hand and wrist at approximately 6 weeks postop if she progresses without complications.  IMPLANTS: Acumed volar locking plate and screw construct

## 2019-07-05 NOTE — Discharge Instructions (Signed)
Tylenol given at 8:15 this morning. Next dose if needed not until 2:15.  Regional Anesthesia Blocks  1. Numbness or the inability to move the "blocked" extremity may last from 3-48 hours after placement. The length of time depends on the medication injected and your individual response to the medication. If the numbness is not going away after 48 hours, call your surgeon.  2. The extremity that is blocked will need to be protected until the numbness is gone and the  Strength has returned. Because you cannot feel it, you will need to take extra care to avoid injury. Because it may be weak, you may have difficulty moving it or using it. You may not know what position it is in without looking at it while the block is in effect.  3. For blocks in the legs and feet, returning to weight bearing and walking needs to be done carefully. You will need to wait until the numbness is entirely gone and the strength has returned. You should be able to move your leg and foot normally before you try and bear weight or walk. You will need someone to be with you when you first try to ensure you do not fall and possibly risk injury.  4. Bruising and tenderness at the needle site are common side effects and will resolve in a few days.  5. Persistent numbness or new problems with movement should be communicated to the surgeon or the [facility] [phone] [facility] [phone]).   Post Anesthesia Home Care Instructions  Activity: Get plenty of rest for the remainder of the day. A responsible individual must stay with you for 24 hours following the procedure.  For the next 24 hours, DO NOT: -Drive a car -Advertising copywriter -Drink alcoholic beverages -Take any medication unless instructed by your physician -Make any legal decisions or sign important papers.  Meals: Start with liquid foods such as gelatin or soup. Progress to regular foods as tolerated. Avoid greasy, spicy,  heavy foods. If nausea and/or vomiting occur, drink only clear liquids until the nausea and/or vomiting subsides. Call your physician if vomiting continues.  Special Instructions/Symptoms: Your throat may feel dry or sore from the anesthesia or the breathing tube placed in your throat during surgery. If this causes discomfort, gargle with warm salt water. The discomfort should disappear within 24 hours.  If you had a scopolamine patch placed behind your ear for the management of post- operative nausea and/or vomiting:  1. The medication in the patch is effective for 72 hours, after which it should be removed.  Wrap patch in a tissue and discard in the trash. Wash hands thoroughly with soap and water. 2. You may remove the patch earlier than 72 hours if you experience unpleasant side effects which may include dry mouth, dizziness or visual disturbances. 3. Avoid touching the patch. Wash your hands with soap and water after contact with the patch.    Discharge Instructions  - Keep dressings in place. Do not remove them. - The dressings must stay dry - Take all medication as prescribed. Transition to over the counter pain medication as your pain improves - Keep the hand elevated over the next 48-72 hours to help with pain and swelling - Move all digits not restricted by the dressings regularly to prevent stiffness - Please call to schedule a follow up appointment with Dr. Roney Mans and therapy at [phone] for 10-14 days following surgery - Your pain medication have been  send digitally to your pharmacy

## 2019-07-05 NOTE — Anesthesia Preprocedure Evaluation (Signed)
Anesthesia Evaluation  Patient identified by MRN, date of birth, ID band Patient awake    Reviewed: Allergy & Precautions, NPO status , Patient's Chart, lab work & pertinent test results  History of Anesthesia Complications (+) PONV and history of anesthetic complications  Airway Mallampati: II  TM Distance: >3 FB Neck ROM: Full    Dental no notable dental hx. (+) Teeth Intact, Dental Advisory Given   Pulmonary sleep apnea and Continuous Positive Airway Pressure Ventilation ,    Pulmonary exam normal breath sounds clear to auscultation       Cardiovascular negative cardio ROS Normal cardiovascular exam Rhythm:Regular Rate:Normal  Echo 2018: - Left ventricle: The cavity size was normal. Systolic function was  normal. The estimated ejection fraction was in the range of 55%  to 60%. Wall motion was normal; there were no regional wall  motion abnormalities.  - Left atrium: The atrium was mildly dilated.  - Atrial septum: No defect or patent foramen ovale was identified.  - Impressions: Mildly decreased global longitudinal strain  localized to mid and basal myocardium   Stress test 2018: nml   Neuro/Psych PSYCHIATRIC DISORDERS Anxiety Depression negative neurological ROS     GI/Hepatic negative GI ROS, Neg liver ROS,   Endo/Other  Hypothyroidism Morbid obesityBMI 42  Renal/GU negative Renal ROS  negative genitourinary   Musculoskeletal  (+) Arthritis , Osteoarthritis,  Left distal radius fx Polymyalgia rheumatica    Abdominal (+) + obese,   Peds negative pediatric ROS (+)  Hematology negative hematology ROS (+)   Anesthesia Other Findings   Reproductive/Obstetrics negative OB ROS                             Anesthesia Physical Anesthesia Plan  ASA: III  Anesthesia Plan: MAC and Regional   Post-op Pain Management:  Regional for Post-op pain   Induction:   PONV Risk Score  and Plan: 3 and Ondansetron, Dexamethasone, Midazolam and Treatment may vary due to age or medical condition  Airway Management Planned: Natural Airway and Nasal Cannula  Additional Equipment: None  Intra-op Plan:   Post-operative Plan:   Informed Consent: I have reviewed the patients History and Physical, chart, labs and discussed the procedure including the risks, benefits and alternatives for the proposed anesthesia with the patient or authorized representative who has indicated his/her understanding and acceptance.       Plan Discussed with: CRNA  Anesthesia Plan Comments:         Anesthesia Quick Evaluation

## 2019-07-05 NOTE — Transfer of Care (Signed)
Immediate Anesthesia Transfer of Care Note  Patient: Kathy Fernandez  Procedure(s) Performed: Left distal radius open reduction, internal fixation and surgery as indicated (Left Wrist)  Patient Location: PACU  Anesthesia Type:MAC combined with regional for post-op pain  Level of Consciousness: awake, alert  and oriented  Airway & Oxygen Therapy: Patient Spontanous Breathing  Post-op Assessment: Report given to RN and Post -op Vital signs reviewed and stable  Post vital signs: Reviewed and stable  Last Vitals:  Vitals Value Taken Time  BP 132/62 07/05/19 1030  Temp    Pulse 64 07/05/19 1031  Resp 18 07/05/19 1031  SpO2 94 % 07/05/19 1031  Vitals shown include unvalidated device data.  Last Pain:  Vitals:   07/05/19 0757  TempSrc: Oral  PainSc: 5          Complications: No apparent anesthesia complications

## 2019-07-05 NOTE — Progress Notes (Signed)
Assisted Dr. Finucane with left, ultrasound guided, supraclavicular block. Side rails up, monitors on throughout procedure. See vital signs in flow sheet. Tolerated Procedure well. 

## 2019-07-05 NOTE — Anesthesia Postprocedure Evaluation (Signed)
Anesthesia Post Note  Patient: Kathy Fernandez  Procedure(s) Performed: Left distal radius open reduction, internal fixation and surgery as indicated (Left Wrist)     Patient location during evaluation: PACU Anesthesia Type: Regional and MAC Level of consciousness: awake and alert Pain management: pain level controlled Vital Signs Assessment: post-procedure vital signs reviewed and stable Respiratory status: spontaneous breathing, nonlabored ventilation and respiratory function stable Cardiovascular status: blood pressure returned to baseline and stable Postop Assessment: no apparent nausea or vomiting Anesthetic complications: no    Last Vitals:  Vitals:   07/05/19 1030 07/05/19 1045  BP: 132/62 124/72  Pulse: 61 61  Resp: 18 19  Temp: 36.6 C   SpO2: 94% 96%    Last Pain:  Vitals:   07/05/19 1030  TempSrc:   PainSc: 0-No pain                 Pervis Hocking

## 2019-07-05 NOTE — Anesthesia Procedure Notes (Signed)
Anesthesia Regional Block: Supraclavicular block   Pre-Anesthetic Checklist: ,, timeout performed, Correct Patient, Correct Site, Correct Laterality, Correct Procedure, Correct Position, site marked, Risks and benefits discussed,  Surgical consent,  Pre-op evaluation,  At surgeon's request and post-op pain management  Laterality: Left  Prep: Maximum Sterile Barrier Precautions used, chloraprep       Needles:  Injection technique: Single-shot  Needle Type: Echogenic Stimulator Needle     Needle Length: 9cm  Needle Gauge: 22     Additional Needles:   Procedures:,,,, ultrasound used (permanent image in chart),,,,  Narrative:  Start time: 07/05/2019 8:20 AM End time: 07/05/2019 8:30 AM Injection made incrementally with aspirations every 5 mL.  Performed by: Personally  Anesthesiologist: Lannie Fields, DO  Additional Notes: Monitors applied. No increased pain on injection. No increased resistance to injection. Injection made in 5cc increments. Good needle visualization. Patient tolerated procedure well.

## 2019-07-05 NOTE — H&P (Signed)
ORTHOPAEDIC H&P  PCP:  Aretta Nip, MD  Chief Complaint: Left wrist pain  HPI: Kathy Fernandez is a 69 y.o. female who complains of left wrist pain.  Last week she tripped over a [place] in her yard and landed onto a outstretched left hand.  She experienced immediate pain and swelling to the wrist and was found to have a displaced, three-part intra-articular distal radius fracture.  She was initially splinted at outside facility and then sent to see me in clinic.  On exam she had persistent displacement of her fracture and we discussed both operative and nonoperative treatment measures.  Patient had dorsal angulation of her fracture as well as diastases of the articular fracture gap.  Because of this we discussed operative intervention in terms of an open reduction and internal fixation.  After having this discussion she did wish to proceed and presents today for surgical fixation of her left wrist.  Past Medical History:  Diagnosis Date  . Achilles tendon tear   . Anxiety   . Depression   . Hypothyroidism   . Polymyalgia rheumatica ([place])   . PONV (postoperative nausea and vomiting)   . Sleep apnea    uses cpap   Past Surgical History:  Procedure Laterality Date  . ABDOMINAL HYSTERECTOMY  2005  . BREAST BIOPSY Right    benign  . CHOLECYSTECTOMY  2007  . DIAGNOSTIC LAPAROSCOPY     Social History   Socioeconomic History  . Marital status: Single    Spouse name: Not on file  . Number of children: Not on file  . Years of education: Not on file  . Highest education level: Not on file  Occupational History  . Not on file  Tobacco Use  . Smoking status: Never Smoker  . Smokeless tobacco: Never Used  Substance and Sexual Activity  . Alcohol use: No    Alcohol/week: 0.0 standard drinks  . Drug use: No  . Sexual activity: Not on file  Other Topics Concern  . Not on file  Social History Narrative  . Not on file   Social Determinants of Health   Financial  Resource Strain:   . Difficulty of Paying Living Expenses:   Food Insecurity:   . Worried About Charity fundraiser in the Last Year:   . Arboriculturist in the Last Year:   Transportation Needs:   . Film/video editor (Medical):   Marland Kitchen Lack of Transportation (Non-Medical):   Physical Activity:   . Days of Exercise per Week:   . Minutes of Exercise per Session:   Stress:   . Feeling of Stress :   Social Connections:   . Frequency of Communication with Friends and Family:   . Frequency of Social Gatherings with Friends and Family:   . Attends Religious Services:   . Active Member of Clubs or Organizations:   . Attends Archivist Meetings:   Marland Kitchen Marital Status:    Family History  Problem Relation Age of Onset  . Hypothyroidism Mother   . Diabetes Mother   . CVA Mother   . Gout Father   . Skin cancer Father   . Hypertension Father   . Heart failure Father   . Rashes / Skin problems Sister   . Polycystic kidney disease Daughter   . Bipolar disorder Son   . Breast cancer Neg Hx    Allergies  Allergen Reactions  . Penicillins Hives and Rash  Has taken Keflex as adult  . Codeine Itching    "thick tongued and could not talk" Can take hydrocodone (Vicodin)  . Neomycin-Bacitracin Zn-Polymyx Rash    blisters  . Neosporin  [Neomycin-Polymyxin-Gramicidin] Rash   Prior to Admission medications   Medication Sig Start Date End Date Taking? Authorizing Provider  FLUoxetine (PROZAC) 20 MG capsule Take 20 mg by mouth daily.   Yes [provider]  HYDROcodone-acetaminophen (NORCO/VICODIN) 5-325 MG tablet Take 1 tablet by mouth every 4 (four) hours as needed for moderate pain. 06/23/19 06/22/20 Yes Mirian Mo, MD  levothyroxine (SYNTHROID, LEVOTHROID) 50 MCG tablet Take 50 mcg by mouth daily before breakfast.   Yes [provider]  pramipexole (MIRAPEX) 0.5 MG tablet Take 0.5 mg by mouth 3 (three) times daily.   Yes [provider]   No results  found.  Positive ROS: All other systems have been reviewed and were otherwise negative with the exception of those mentioned in the HPI and as above.  Physical Exam: General: Alert, no acute distress Cardiovascular: No pedal edema Respiratory: No cyanosis, no use of accessory musculature Skin: No lesions in the area of chief complaint Psychiatric: Patient is competent for consent with normal mood and affect  MUSCULOSKELETAL: Examination of the left wrist shows mildly swollen wrist. There is no significant angular deformity, open wounds, erythema or signs of infection. She has tenderness to palpation to both the palmar and dorsal aspects of the distal radius. She also has mild tenderness at the distal ulna. She is intact though decreased range of motion of the wrist. This includes flexion, extension, pronation and supination. All motion to the wrist does cause her pain. She has intact flexion and extension to the digits. She has intact sensation throughout all digits. Her fingertips are all warm and well-perfused with brisk capillary refill. Her sensation is grossly intact to light touch throughout all digits. She has no tenderness to palpation proximally in the forearm or elbow.  Assessment: Left 3 part intra-articular distal radius fracture  Plan: Plan to proceed to the OR today for open reduction and internal fixation of her left distal radius fracture.  The risks of surgery were discussed with her once again.  These include but are not limited to infection, bleeding, damage to surrounding structures including blood vessels and nerves, pain, stiffness, malunion, nonunion, implant failure, posttraumatic arthritis and need for additional procedures.  Informed consent was obtained today.  Plan for discharge home postoperatively with follow-up with me in approximately 10 to 14 days.    Ernest Mallick, MD [phone]   07/05/2019 7:21 AM

## 2019-07-06 ENCOUNTER — Encounter: Payer: Self-pay | Admitting: Certified Registered"

## 2019-07-06 NOTE — Addendum Note (Signed)
Addendum  created 07/06/19 1256 by Lance Coon, CRNA   Charge Capture section accepted

## 2019-07-20 DIAGNOSIS — S52532A Colles' fracture of left radius, initial encounter for closed fracture: Secondary | ICD-10-CM | POA: Diagnosis not present

## 2019-07-20 DIAGNOSIS — M25532 Pain in left wrist: Secondary | ICD-10-CM | POA: Diagnosis not present

## 2019-07-29 DIAGNOSIS — M25532 Pain in left wrist: Secondary | ICD-10-CM | POA: Diagnosis not present

## 2019-08-05 DIAGNOSIS — M25532 Pain in left wrist: Secondary | ICD-10-CM | POA: Diagnosis not present

## 2019-08-12 DIAGNOSIS — M25532 Pain in left wrist: Secondary | ICD-10-CM | POA: Diagnosis not present

## 2019-08-19 DIAGNOSIS — M25532 Pain in left wrist: Secondary | ICD-10-CM | POA: Diagnosis not present

## 2019-08-19 DIAGNOSIS — Z01419 Encounter for gynecological examination (general) (routine) without abnormal findings: Secondary | ICD-10-CM | POA: Diagnosis not present

## 2019-08-20 DIAGNOSIS — S52532A Colles' fracture of left radius, initial encounter for closed fracture: Secondary | ICD-10-CM | POA: Diagnosis not present

## 2019-09-09 DIAGNOSIS — R21 Rash and other nonspecific skin eruption: Secondary | ICD-10-CM | POA: Diagnosis not present

## 2019-09-09 DIAGNOSIS — W57XXXA Bitten or stung by nonvenomous insect and other nonvenomous arthropods, initial encounter: Secondary | ICD-10-CM | POA: Diagnosis not present

## 2019-09-09 DIAGNOSIS — R509 Fever, unspecified: Secondary | ICD-10-CM | POA: Diagnosis not present

## 2019-09-09 DIAGNOSIS — S30861A Insect bite (nonvenomous) of abdominal wall, initial encounter: Secondary | ICD-10-CM | POA: Diagnosis not present

## 2019-09-11 DIAGNOSIS — Z03818 Encounter for observation for suspected exposure to other biological agents ruled out: Secondary | ICD-10-CM | POA: Diagnosis not present

## 2019-09-11 DIAGNOSIS — R509 Fever, unspecified: Secondary | ICD-10-CM | POA: Diagnosis not present

## 2019-09-11 DIAGNOSIS — S30860D Insect bite (nonvenomous) of lower back and pelvis, subsequent encounter: Secondary | ICD-10-CM | POA: Diagnosis not present

## 2019-09-11 DIAGNOSIS — R21 Rash and other nonspecific skin eruption: Secondary | ICD-10-CM | POA: Diagnosis not present

## 2019-09-14 DIAGNOSIS — L309 Dermatitis, unspecified: Secondary | ICD-10-CM | POA: Diagnosis not present

## 2019-09-14 DIAGNOSIS — L920 Granuloma annulare: Secondary | ICD-10-CM | POA: Diagnosis not present

## 2019-09-16 DIAGNOSIS — R21 Rash and other nonspecific skin eruption: Secondary | ICD-10-CM | POA: Diagnosis not present

## 2019-09-28 DIAGNOSIS — R5382 Chronic fatigue, unspecified: Secondary | ICD-10-CM | POA: Diagnosis not present

## 2019-09-28 DIAGNOSIS — R21 Rash and other nonspecific skin eruption: Secondary | ICD-10-CM | POA: Diagnosis not present

## 2020-06-21 ENCOUNTER — Other Ambulatory Visit: Payer: Self-pay | Admitting: Family Medicine

## 2020-06-21 DIAGNOSIS — Z1231 Encounter for screening mammogram for malignant neoplasm of breast: Secondary | ICD-10-CM

## 2020-08-10 ENCOUNTER — Ambulatory Visit: Payer: Medicare Other | Admitting: Podiatry

## 2020-08-10 ENCOUNTER — Ambulatory Visit (INDEPENDENT_AMBULATORY_CARE_PROVIDER_SITE_OTHER): Payer: Medicare Other

## 2020-08-10 ENCOUNTER — Other Ambulatory Visit: Payer: Self-pay

## 2020-08-10 DIAGNOSIS — M79672 Pain in left foot: Secondary | ICD-10-CM

## 2020-08-10 DIAGNOSIS — M7752 Other enthesopathy of left foot: Secondary | ICD-10-CM | POA: Diagnosis not present

## 2020-08-10 MED ORDER — METHYLPREDNISOLONE 4 MG PO TBPK: ORAL_TABLET | ORAL | 0 refills | Status: AC

## 2020-08-10 NOTE — Patient Instructions (Signed)
For instructions on how to put on your Tri-Lock Ankle Brace, please visit [email or web address]   Peroneal Tendinopathy Rehab Ask your health care provider which exercises are safe for you. Do exercises exactly as told by your health care provider and adjust them as directed. It is normal to feel mild stretching, pulling, tightness, or discomfort as you do these exercises. Stop right away if you feel sudden pain or your pain gets worse. Do not begin these exercises until told by your health care provider. Stretching and range-of-motion exercises These exercises warm up your muscles and joints and improve the movement and flexibility of your ankle. These exercises also help to relieve pain and stiffness. Gastroc and soleus stretch, standing This is an exercise in which you stand on a step and use your body weight to stretch your calf muscles. To do this exercise: 1. Stand on the edge of a step on the ball of your left / right foot. The ball of your foot is on the walking surface, right under your toes. 2. Keep your other foot firmly on the same step. 3. Hold on to the wall, a railing, or a chair for balance. 4. Slowly lift your other foot, allowing your body weight to press your left / right heel down over the edge of the step. You should feel a stretch in your left / right calf (gastrocnemius and soleus). 5. Hold this position for __________ seconds. 6. Return both feet to the step. 7. Repeat this exercise with a slight bend in your left / right knee. Repeat __________ times with your left / right knee straight and __________ times with your left / right knee bent. Complete this exercise __________ times a day.   Strengthening exercises These exercises build strength and endurance in your foot and ankle. Endurance is the ability to use your muscles for a long time, even after they get tired. Ankle dorsiflexion with band 1. Secure a rubber exercise band or tube to an object, such as a table  leg, that will not move when the band is pulled. 2. Secure the other end of the band around your left / right foot. 3. Sit on the floor, facing the object with your left / right leg extended. The band or tube should be slightly tense when your foot is relaxed. 4. Slowly flex your left / right ankle and toes to bring your foot toward you (dorsiflexion). 5. Hold this position for __________ seconds. 6. Let the band or tube slowly pull your foot back to the starting position. Repeat __________ times. Complete this exercise __________ times a day.   Ankle eversion 1. Sit on the floor with your legs straight out in front of you. 2. Loop a rubber exercise band or tube around the ball of your left / right foot. The ball of your foot is on the walking surface, right under your toes. 3. Hold the ends of the band in your hands, or secure the band to a stable object. The band or tube should be slightly tense when your foot is relaxed. 4. Slowly push your foot outward, away from your other leg (eversion). 5. Hold this position for __________ seconds. 6. Slowly return your foot to the starting position. Repeat __________ times. Complete this exercise __________ times a day. Plantar flexion, standing This exercise is sometimes called standing heel raise. 1. Stand with your feet shoulder-width apart. 2. Place your hands on a wall or table to steady yourself as needed, but try not to  use it for support. 3. Keep your weight spread evenly over the width of your feet while you slowly rise up on your toes (plantar flexion). If told by your health care provider: ? Shift your weight toward your left / right leg until you feel challenged. ? Stand on your left / right leg only. 4. Hold this position for __________ seconds. Repeat __________ times. Complete this exercise __________ times a day.   Single leg stand 1. Without shoes, stand near a railing or in a doorway. You may hold on to the railing or door frame as  needed. 2. Stand on your left / right foot. Keep your big toe down on the floor and try to keep your arch lifted. ? Do not roll to the outside of your foot. ? If this exercise is too easy, you can try it with your eyes closed or while standing on a pillow. 3. Hold this position for __________ seconds. Repeat __________ times. Complete this exercise __________ times a day. This information is not intended to replace advice given to you by your health care provider. Make sure you discuss any questions you have with your health care provider. Document Revised: 06/30/2018 Document Reviewed: 06/30/2018 Elsevier Patient Education  2021 [employer].

## 2020-08-11 ENCOUNTER — Ambulatory Visit
Admission: RE | Admit: 2020-08-11 | Discharge: 2020-08-11 | Disposition: A | Discharge status: Home / Self Care | Payer: BC Managed Care – PPO | Source: Ambulatory Visit | Attending: Family Medicine | Admitting: Family Medicine

## 2020-08-11 DIAGNOSIS — Z1231 Encounter for screening mammogram for malignant neoplasm of breast: Secondary | ICD-10-CM

## 2020-08-14 NOTE — Progress Notes (Signed)
Subjective:   Patient ID: Kathy Fernandez, female   DOB: 70 y.o.   MRN: [id number]   HPI 70 year old female presents the office today for concerns of left foot pain.  She states that about 6 weeks ago the pain is mostly the lateral aspect of the foot on the fifth metatarsal base that she points to.  She is tried icing as well as over-the-counter medications.  She has a previous history of Achilles tendon surgery on that side but has been doing well.  No recent injury or falls that she reports no other concerns.  Review of Systems  All other systems reviewed and are negative.  Past Medical History:  Diagnosis Date  . Achilles tendon tear   . Anxiety   . Depression   . Hypothyroidism   . Polymyalgia rheumatica ([facility])   . PONV (postoperative nausea and vomiting)   . Sleep apnea    uses cpap    Past Surgical History:  Procedure Laterality Date  . ABDOMINAL HYSTERECTOMY  2005  . BREAST BIOPSY Right    benign  . CHOLECYSTECTOMY  2007  . DIAGNOSTIC LAPAROSCOPY    . ORIF WRIST FRACTURE Left 07/05/2019   Procedure: Left distal radius open reduction, internal fixation and surgery as indicated;  Surgeon: Ernest Mallick, MD;  Location: [facility];  Service: Orthopedics;  Laterality: Left;      Current Outpatient Medications:  .  methylPREDNISolone (MEDROL DOSEPAK) 4 MG TBPK tablet, Take as directed, Disp: 21 tablet, Rfl: 0 .  acetaminophen (TYLENOL) 500 MG tablet, Take 500 mg by mouth every 6 (six) hours as needed., Disp: , Rfl:  .  FLUoxetine (PROZAC) 20 MG capsule, Take 20 mg by mouth daily., Disp: , Rfl:  .  ibuprofen (ADVIL) 600 MG tablet, Take 600 mg by mouth every 6 (six) hours as needed., Disp: , Rfl:  .  levothyroxine (SYNTHROID, LEVOTHROID) 50 MCG tablet, Take 50 mcg by mouth daily before breakfast., Disp: , Rfl:  .  pramipexole (MIRAPEX) 0.5 MG tablet, Take 0.5 mg by mouth 3 (three) times daily., Disp: , Rfl:   Allergies  Allergen Reactions  .  Penicillins Hives and Rash    Has taken Keflex as adult  . Sulfa Antibiotics Itching, Other (See Comments) and Rash  . Codeine Itching    "thick tongued and could not talk" Can take hydrocodone (Vicodin)  . Neomycin-Bacitracin Zn-Polymyx Rash    blisters  . Neosporin  [Neomycin-Polymyxin-Gramicidin] Rash          Objective:  Physical Exam  General: AAO x3, NAD  Dermatological: Skin is warm, dry and supple bilateral. There are no open sores, no preulcerative lesions, no rash or signs of infection present.  Vascular: Dorsalis Pedis artery and Posterior Tibial artery pedal pulses are 2/4 bilateral with immedate capillary fill time. There is no pain with calf compression, swelling, warmth, erythema.   Neruologic: Grossly intact via light touch bilateral.  Negative Tinel sign.  Musculoskeletal: Tenderness on the fifth metatarsal base on the lateral aspect the left foot.  There is minimal discomfort the distal portion of the peroneal tendon.  The tendon appears to be intact.  Trace edema.  There is no erythema or warmth.  Flexor, extensor tendons appear to be intact otherwise.  No other areas of discomfort.  Muscular strength 5/5 in all groups tested bilateral.  Gait: Unassisted, Nonantalgic.       Assessment:   Insertional peroneal tendinitis left foot  Plan:  -Treatment options discussed including all alternatives, risks, and complications -Etiology of symptoms were discussed -X-rays were obtained and reviewed with the patient.  No evidence of acute fracture or stress fracture identified today. -Trilock ankle brace dispensed -Medrol dose pack -Stretching/icing daily -Discussed shoe modifications and orthotics  Vivi Barrack DPM

## 2020-08-25 ENCOUNTER — Other Ambulatory Visit (HOSPITAL_BASED_OUTPATIENT_CLINIC_OR_DEPARTMENT_OTHER): Payer: Self-pay

## 2020-08-25 ENCOUNTER — Other Ambulatory Visit: Payer: Self-pay

## 2020-08-25 ENCOUNTER — Ambulatory Visit: Payer: Medicare Other | Attending: Internal Medicine

## 2020-08-25 DIAGNOSIS — Z23 Encounter for immunization: Secondary | ICD-10-CM

## 2020-08-25 MED ORDER — COVID-19 MRNA VACC (MODERNA) 100 MCG/0.5ML IM SUSP
INTRAMUSCULAR | 0 refills | Status: AC
  Filled 2020-08-25: qty 0.25, 1d supply, fill #0

## 2020-08-25 NOTE — Progress Notes (Signed)
   Covid-19 Vaccination Clinic  Name:  Kathy Fernandez    MRN: [id number] DOB: 06-May-1950  08/25/2020  [name] was observed post Covid-19 immunization for 15 minutes without incident. She was provided with Vaccine Information Sheet and instruction to access the V-Safe system.   [name] was instructed to call 911 with any severe reactions post vaccine: Marland Kitchen Difficulty breathing  . Swelling of face and throat  . A fast heartbeat  . A bad rash all over body  . Dizziness and weakness   Immunizations Administered    Name Date Dose VIS Date Route   Moderna Covid-19 Booster Vaccine 08/25/2020 11:36 AM 0.25 mL 01/12/2020 Intramuscular   Manufacturer: Moderna   Lot: [id number]   NDC: 74259-563-87

## 2020-08-31 ENCOUNTER — Ambulatory Visit: Payer: Medicare Other | Admitting: Podiatry

## 2020-08-31 ENCOUNTER — Other Ambulatory Visit: Payer: Self-pay

## 2020-08-31 DIAGNOSIS — M79672 Pain in left foot: Secondary | ICD-10-CM

## 2020-08-31 DIAGNOSIS — M7752 Other enthesopathy of left foot: Secondary | ICD-10-CM

## 2020-08-31 NOTE — Patient Instructions (Signed)
Peroneal Tendinopathy Rehab Ask your health care provider which exercises are safe for you. Do exercises exactly as told by your health care provider and adjust them as directed. It is normal to feel mild stretching, pulling, tightness, or discomfort as you do these exercises. Stop right away if you feel sudden pain or your pain gets worse. Do not begin these exercises until told by your health care provider. Stretching and range-of-motion exercises These exercises warm up your muscles and joints and improve the movement and flexibility of your ankle. These exercises also help to relieve pain and stiffness. Gastroc and soleus stretch, standing This is an exercise in which you stand on a step and use your body weight to stretch your calf muscles. To do this exercise: Stand on the edge of a step on the ball of your left / right foot. The ball of your foot is on the walking surface, right under your toes. Keep your other foot firmly on the same step. Hold on to the wall, a railing, or a chair for balance. Slowly lift your other foot, allowing your body weight to press your left / right heel down over the edge of the step. You should feel a stretch in your left / right calf (gastrocnemius and soleus). Hold this position for __________ seconds. Return both feet to the step. Repeat this exercise with a slight bend in your left / right knee. Repeat __________ times with your left / right knee straight and __________ times with your left / right knee bent. Complete this exercise __________ times a day.   Strengthening exercises These exercises build strength and endurance in your foot and ankle. Endurance is the ability to use your muscles for a long time, even after they get tired. Ankle dorsiflexion with band Secure a rubber exercise band or tube to an object, such as a table leg, that will not move when the band is pulled. Secure the other end of the band around your left / right foot. Sit on the  floor, facing the object with your left / right leg extended. The band or tube should be slightly tense when your foot is relaxed. Slowly flex your left / right ankle and toes to bring your foot toward you (dorsiflexion). Hold this position for __________ seconds. Let the band or tube slowly pull your foot back to the starting position. Repeat __________ times. Complete this exercise __________ times a day.   Ankle eversion Sit on the floor with your legs straight out in front of you. Loop a rubber exercise band or tube around the ball of your left / right foot. The ball of your foot is on the walking surface, right under your toes. Hold the ends of the band in your hands, or secure the band to a stable object. The band or tube should be slightly tense when your foot is relaxed. Slowly push your foot outward, away from your other leg (eversion). Hold this position for __________ seconds. Slowly return your foot to the starting position. Repeat __________ times. Complete this exercise __________ times a day. Plantar flexion, standing This exercise is sometimes called standing heel raise. Stand with your feet shoulder-width apart. Place your hands on a wall or table to steady yourself as needed, but try not to use it for support. Keep your weight spread evenly over the width of your feet while you slowly rise up on your toes (plantar flexion). If told by your health care provider: Shift your weight toward your  left / right leg until you feel challenged. Stand on your left / right leg only. Hold this position for __________ seconds. Repeat __________ times. Complete this exercise __________ times a day.   Single leg stand Without shoes, stand near a railing or in a doorway. You may hold on to the railing or door frame as needed. Stand on your left / right foot. Keep your big toe down on the floor and try to keep your arch lifted. Do not roll to the outside of your foot. If this exercise is too  easy, you can try it with your eyes closed or while standing on a pillow. Hold this position for __________ seconds. Repeat __________ times. Complete this exercise __________ times a day. This information is not intended to replace advice given to you by your health care provider. Make sure you discuss any questions you have with your health care provider. Document Revised: 06/30/2018 Document Reviewed: 06/30/2018 Elsevier Patient Education  2021 [employer].

## 2020-09-06 NOTE — Progress Notes (Signed)
Subjective: 70 year old female presents the office today for evaluation of tendinitis of her left side.  She states she is doing much better than significant provement.  She had.  Minor discomfort but states is almost completely resolved.  She is no longer limping.  The brace was helpful.  She is returned to work on Monday doing great. Denies any systemic complaints such as fevers, chills, nausea, vomiting. No acute changes since last appointment, and no other complaints at this time.   Objective: AAO x3, NAD DP/PT pulses palpable bilaterally, CRT less than 3 seconds At this time there is no area of pinpoint tenderness.  Specifically there is no tenderness along the course or insertion of peroneal tendon or to the fifth metatarsal base.  No edema, erythema.  Flexor, extensor tendons appear to be intact.  MMT 5/5. No pain with calf compression, swelling, warmth, erythema  Assessment: Resolved left foot pain, tendinitis  Plan: -All treatment options discussed with the patient including all alternatives, risks, complications.  -As her symptoms are improved will be encouraged to continue with stretching, icing daily.  Discussed supportive shoes and offloading.  The brace as needed. -Patient encouraged to call the office with any questions, concerns, change in symptoms.   Return if symptoms worsen or fail to improve.  Vivi Barrack DPM

## 2020-11-30 DIAGNOSIS — M25531 Pain in right wrist: Secondary | ICD-10-CM | POA: Diagnosis not present

## 2020-12-21 DIAGNOSIS — M25531 Pain in right wrist: Secondary | ICD-10-CM | POA: Diagnosis not present

## 2021-05-21 ENCOUNTER — Other Ambulatory Visit: Payer: Self-pay | Admitting: Family Medicine

## 2021-05-21 DIAGNOSIS — Z1231 Encounter for screening mammogram for malignant neoplasm of breast: Secondary | ICD-10-CM

## 2021-05-28 DIAGNOSIS — M25561 Pain in right knee: Secondary | ICD-10-CM | POA: Diagnosis not present

## 2021-06-12 DIAGNOSIS — H2513 Age-related nuclear cataract, bilateral: Secondary | ICD-10-CM | POA: Diagnosis not present

## 2021-07-05 DIAGNOSIS — Z6841 Body Mass Index (BMI) 40.0 and over, adult: Secondary | ICD-10-CM | POA: Diagnosis not present

## 2021-07-05 DIAGNOSIS — R5382 Chronic fatigue, unspecified: Secondary | ICD-10-CM | POA: Diagnosis not present

## 2021-07-05 DIAGNOSIS — Z8739 Personal history of other diseases of the musculoskeletal system and connective tissue: Secondary | ICD-10-CM | POA: Diagnosis not present

## 2021-07-05 DIAGNOSIS — M791 Myalgia, unspecified site: Secondary | ICD-10-CM | POA: Diagnosis not present

## 2021-08-02 DIAGNOSIS — Z8739 Personal history of other diseases of the musculoskeletal system and connective tissue: Secondary | ICD-10-CM | POA: Diagnosis not present

## 2021-08-02 DIAGNOSIS — R5382 Chronic fatigue, unspecified: Secondary | ICD-10-CM | POA: Diagnosis not present

## 2021-08-02 DIAGNOSIS — M791 Myalgia, unspecified site: Secondary | ICD-10-CM | POA: Diagnosis not present

## 2021-08-02 DIAGNOSIS — M353 Polymyalgia rheumatica: Secondary | ICD-10-CM | POA: Diagnosis not present

## 2021-08-13 ENCOUNTER — Ambulatory Visit
Admission: RE | Admit: 2021-08-13 | Discharge: 2021-08-13 | Disposition: A | Discharge status: Home / Self Care | Payer: Medicare Other | Source: Ambulatory Visit | Attending: Family Medicine | Admitting: Family Medicine

## 2021-08-13 DIAGNOSIS — Z1231 Encounter for screening mammogram for malignant neoplasm of breast: Secondary | ICD-10-CM

## 2021-09-06 DIAGNOSIS — Z8739 Personal history of other diseases of the musculoskeletal system and connective tissue: Secondary | ICD-10-CM | POA: Diagnosis not present

## 2021-09-06 DIAGNOSIS — M791 Myalgia, unspecified site: Secondary | ICD-10-CM | POA: Diagnosis not present

## 2021-09-06 DIAGNOSIS — R5382 Chronic fatigue, unspecified: Secondary | ICD-10-CM | POA: Diagnosis not present

## 2021-09-06 DIAGNOSIS — M353 Polymyalgia rheumatica: Secondary | ICD-10-CM | POA: Diagnosis not present

## 2021-09-14 DIAGNOSIS — R600 Localized edema: Secondary | ICD-10-CM | POA: Diagnosis not present

## 2021-09-22 ENCOUNTER — Emergency Department (HOSPITAL_BASED_OUTPATIENT_CLINIC_OR_DEPARTMENT_OTHER): Payer: Medicare Other

## 2021-09-22 ENCOUNTER — Other Ambulatory Visit: Payer: Self-pay

## 2021-09-22 ENCOUNTER — Emergency Department (HOSPITAL_BASED_OUTPATIENT_CLINIC_OR_DEPARTMENT_OTHER)
Admission: EM | Admit: 2021-09-22 | Discharge: 2021-09-22 | Disposition: A | Discharge status: Home / Self Care | Payer: Medicare Other | Attending: Emergency Medicine | Admitting: Emergency Medicine

## 2021-09-22 DIAGNOSIS — R220 Localized swelling, mass and lump, head: Secondary | ICD-10-CM | POA: Diagnosis not present

## 2021-09-22 DIAGNOSIS — Z043 Encounter for examination and observation following other accident: Secondary | ICD-10-CM | POA: Diagnosis not present

## 2021-09-22 DIAGNOSIS — W19XXXA Unspecified fall, initial encounter: Secondary | ICD-10-CM | POA: Insufficient documentation

## 2021-09-22 DIAGNOSIS — S199XXA Unspecified injury of neck, initial encounter: Secondary | ICD-10-CM | POA: Diagnosis not present

## 2021-09-22 DIAGNOSIS — S0990XA Unspecified injury of head, initial encounter: Secondary | ICD-10-CM | POA: Diagnosis not present

## 2021-09-22 DIAGNOSIS — S161XXA Strain of muscle, fascia and tendon at neck level, initial encounter: Secondary | ICD-10-CM

## 2021-09-22 NOTE — Discharge Instructions (Signed)
Take ibuprofen 600 mg every 6 hours as needed for pain.  Return to the emergency department if you develop severe headache, severe nausea/vomiting, worsening pain, or for other new and concerning symptoms.

## 2021-09-22 NOTE — ED Notes (Signed)
Pt agreeable with d/c plan as discussed by provider- this nurse has verbally reinforced d/c instructions and provided pt with written copy.   Pt acknowledges verbal understanding and denies any additional questions, concerns, needs- pt ambulatory independently at d/c with steady gait;no distress; vitals stable - being driven home by spouse.

## 2021-09-22 NOTE — ED Notes (Signed)
Patient transported to CT via w/c. 

## 2021-09-22 NOTE — ED Provider Notes (Signed)
[facility] EMERGENCY DEPT Provider Note   CSN: [id number] Arrival date & time: 09/22/21  0205     History  Chief Complaint  Patient presents with   Head Injury   Fall    Kathy Fernandez is a 71 y.o. female.  Patient is a 71 year old female presenting with complaints of a head injury.  She states that she woke in the night and noticed the flood light was on in the backyard.  She leaned against the nightstand to look out the window when a part of the nightstand broke.  This caused her to fall forward and strike her head on the windowsill.  She denies having lost consciousness, but reports "seeing stars and significant headache.  She also describes some neck discomfort.  She denies other injury.  She denies any numbness or weakness.  She denies any visual disturbances.  The history is provided by the patient.       Home Medications Prior to Admission medications   Medication Sig Start Date End Date Taking? Authorizing Provider  acetaminophen (TYLENOL) 500 MG tablet Take 500 mg by mouth every 6 (six) hours as needed.    [provider]  COVID-19 mRNA vaccine, Moderna, 100 MCG/0.5ML injection Inject into the muscle. 08/25/20   Judyann Munson, MD  FLUoxetine (PROZAC) 20 MG capsule Take 20 mg by mouth daily.    [provider]  ibuprofen (ADVIL) 600 MG tablet Take 600 mg by mouth every 6 (six) hours as needed.    [provider]  levothyroxine (SYNTHROID, LEVOTHROID) 50 MCG tablet Take 50 mcg by mouth daily before breakfast.    [provider]  methylPREDNISolone (MEDROL DOSEPAK) 4 MG TBPK tablet Take as directed 08/10/20   Vivi Barrack, DPM  pramipexole (MIRAPEX) 0.5 MG tablet Take 0.5 mg by mouth 3 (three) times daily.    [provider]      Allergies    Penicillins, Sulfa antibiotics, Codeine, Neomycin-bacitracin zn-polymyx, and Neosporin  [neomycin-polymyxin-gramicidin]    Review of Systems   Review of Systems  All  other systems reviewed and are negative.   Physical Exam Updated Vital Signs BP (!) 145/68   Pulse (!) 59   Temp 97.8 F (36.6 C) (Oral)   Resp 18   Ht 5\' 6"  (1.676 m)   Wt 122.5 kg   SpO2 100%   BMI 43.58 kg/m  Physical Exam Vitals and nursing note reviewed.  Constitutional:      General: She is not in acute distress.    Appearance: She is well-developed. She is not diaphoretic.  HENT:     Head: Normocephalic.     Comments: There is swelling to the forehead just above the left eyebrow. Eyes:     Extraocular Movements: Extraocular movements intact.     Pupils: Pupils are equal, round, and reactive to light.     Comments: Extraocular muscles are intact.  There is no evidence for orbital entrapment.  Cardiovascular:     Rate and Rhythm: Normal rate and regular rhythm.     Heart sounds: No murmur heard.    No friction rub. No gallop.  Pulmonary:     Effort: Pulmonary effort is normal. No respiratory distress.     Breath sounds: Normal breath sounds. No wheezing.  Abdominal:     General: Bowel sounds are normal. There is no distension.     Palpations: Abdomen is soft.     Tenderness: There is no abdominal tenderness.  Musculoskeletal:  General: Normal range of motion.     Cervical back: Normal range of motion and neck supple.  Skin:    General: Skin is warm and dry.  Neurological:     General: No focal deficit present.     Mental Status: She is alert and oriented to person, place, and time.     ED Results / Procedures / Treatments   Labs (all labs ordered are listed, but only abnormal results are displayed) Labs Reviewed - No data to display  EKG None  Radiology No results found.  Procedures Procedures    Medications Ordered in ED Medications - No data to display  ED Course/ Medical Decision Making/ A&P  Patient presenting here with complaints of head and neck injury as described in the HPI.  There is no loss of consciousness and she is  neurologically intact.  Head CT is negative.  Cervical spine CT is negative.  Patient seems appropriate for discharge with return as needed.  Final Clinical Impression(s) / ED Diagnoses Final diagnoses:  None    Rx / DC Orders ED Discharge Orders     None         Geoffery Lyons, MD 09/22/21 [phone]

## 2021-09-22 NOTE — ED Notes (Signed)
Pt awake and alert; GCS 15.  No neuro deficits noted s/p hitting head on nightstand prior to arrival.  Pt reports "seeing stars" after hitting head and having nausea however denies visual changes and nausea at this time.  Pt also denies dizziness.  Does c/o ongoing posterior neck and L forehead/head pain rated 7/10 described as throbbing.  Also reports hitting L anterior lower leg - small scratch noted to this region.  RR even and unlabored on RA with symmetrical rise and fall of chest.  O2 sats 100% on RA.  Cardiac monitoring initiated - SB HR 57 - NSR HR 62 on cardiac monitor.  Aside from L lower leg scratch and L forehead hematoma with erythema, skin is warm dry and intact.  Abdomen round, soft, nontender.  Pt updated on plan for CT imaging and is agreeable.  Denies any immediate needs, questions, concerns at this time.  This nurse to monitor for acute changes and maintain plan of care.  Call bell in reach and pt oriented to environment.

## 2021-09-22 NOTE — ED Triage Notes (Signed)
POV, pt sts that she was leaning on nightstand and rail broke, hit head on windowsill. Denies blood thinners or LOC. No laceration noted. Hematoma to left side of forehead. Alert and oriented x 4, ambulatory with standby assist

## 2021-09-22 NOTE — ED Notes (Signed)
Late entry-- Pt back in room from CT dept -- no acute changes; awaiting CT results at this time.

## 2021-10-02 DIAGNOSIS — R7309 Other abnormal glucose: Secondary | ICD-10-CM | POA: Diagnosis not present

## 2021-10-02 DIAGNOSIS — E559 Vitamin D deficiency, unspecified: Secondary | ICD-10-CM | POA: Diagnosis not present

## 2021-10-02 DIAGNOSIS — Z6841 Body Mass Index (BMI) 40.0 and over, adult: Secondary | ICD-10-CM | POA: Diagnosis not present

## 2021-10-02 DIAGNOSIS — E785 Hyperlipidemia, unspecified: Secondary | ICD-10-CM | POA: Diagnosis not present

## 2021-10-02 DIAGNOSIS — E039 Hypothyroidism, unspecified: Secondary | ICD-10-CM | POA: Diagnosis not present

## 2021-11-20 DIAGNOSIS — M353 Polymyalgia rheumatica: Secondary | ICD-10-CM | POA: Diagnosis not present

## 2021-11-20 DIAGNOSIS — Z8739 Personal history of other diseases of the musculoskeletal system and connective tissue: Secondary | ICD-10-CM | POA: Diagnosis not present

## 2021-11-20 DIAGNOSIS — R5382 Chronic fatigue, unspecified: Secondary | ICD-10-CM | POA: Diagnosis not present

## 2021-11-20 DIAGNOSIS — R601 Generalized edema: Secondary | ICD-10-CM | POA: Diagnosis not present

## 2022-02-12 DIAGNOSIS — R5382 Chronic fatigue, unspecified: Secondary | ICD-10-CM | POA: Diagnosis not present

## 2022-02-12 DIAGNOSIS — R601 Generalized edema: Secondary | ICD-10-CM | POA: Diagnosis not present

## 2022-02-12 DIAGNOSIS — M353 Polymyalgia rheumatica: Secondary | ICD-10-CM | POA: Diagnosis not present

## 2022-02-12 DIAGNOSIS — Z8739 Personal history of other diseases of the musculoskeletal system and connective tissue: Secondary | ICD-10-CM | POA: Diagnosis not present

## 2022-05-01 DIAGNOSIS — E559 Vitamin D deficiency, unspecified: Secondary | ICD-10-CM | POA: Diagnosis not present

## 2022-05-01 DIAGNOSIS — E785 Hyperlipidemia, unspecified: Secondary | ICD-10-CM | POA: Diagnosis not present

## 2022-05-01 DIAGNOSIS — R7309 Other abnormal glucose: Secondary | ICD-10-CM | POA: Diagnosis not present

## 2022-05-01 DIAGNOSIS — E039 Hypothyroidism, unspecified: Secondary | ICD-10-CM | POA: Diagnosis not present

## 2022-05-01 DIAGNOSIS — G2581 Restless legs syndrome: Secondary | ICD-10-CM | POA: Diagnosis not present

## 2022-05-09 DIAGNOSIS — K080 Exfoliation of teeth due to systemic causes: Secondary | ICD-10-CM | POA: Diagnosis not present

## 2022-05-15 DIAGNOSIS — R601 Generalized edema: Secondary | ICD-10-CM | POA: Diagnosis not present

## 2022-05-15 DIAGNOSIS — Z8739 Personal history of other diseases of the musculoskeletal system and connective tissue: Secondary | ICD-10-CM | POA: Diagnosis not present

## 2022-05-15 DIAGNOSIS — R5382 Chronic fatigue, unspecified: Secondary | ICD-10-CM | POA: Diagnosis not present

## 2022-05-15 DIAGNOSIS — M353 Polymyalgia rheumatica: Secondary | ICD-10-CM | POA: Diagnosis not present

## 2022-05-22 DIAGNOSIS — G4733 Obstructive sleep apnea (adult) (pediatric): Secondary | ICD-10-CM | POA: Diagnosis not present

## 2022-05-22 DIAGNOSIS — H903 Sensorineural hearing loss, bilateral: Secondary | ICD-10-CM | POA: Diagnosis not present

## 2022-06-10 DIAGNOSIS — M25561 Pain in right knee: Secondary | ICD-10-CM | POA: Diagnosis not present

## 2022-06-10 DIAGNOSIS — M25562 Pain in left knee: Secondary | ICD-10-CM | POA: Diagnosis not present

## 2022-06-13 DIAGNOSIS — H524 Presbyopia: Secondary | ICD-10-CM | POA: Diagnosis not present

## 2022-06-13 DIAGNOSIS — H2513 Age-related nuclear cataract, bilateral: Secondary | ICD-10-CM | POA: Diagnosis not present

## 2022-06-13 DIAGNOSIS — H43813 Vitreous degeneration, bilateral: Secondary | ICD-10-CM | POA: Diagnosis not present

## 2022-07-22 DIAGNOSIS — Z6841 Body Mass Index (BMI) 40.0 and over, adult: Secondary | ICD-10-CM | POA: Diagnosis not present

## 2022-07-22 DIAGNOSIS — Z0000 Encounter for general adult medical examination without abnormal findings: Secondary | ICD-10-CM | POA: Diagnosis not present

## 2022-07-22 DIAGNOSIS — Z9181 History of falling: Secondary | ICD-10-CM | POA: Diagnosis not present

## 2022-07-23 DIAGNOSIS — G4733 Obstructive sleep apnea (adult) (pediatric): Secondary | ICD-10-CM | POA: Diagnosis not present

## 2022-07-30 ENCOUNTER — Other Ambulatory Visit: Payer: Self-pay | Admitting: Family Medicine

## 2022-07-30 DIAGNOSIS — Z1231 Encounter for screening mammogram for malignant neoplasm of breast: Secondary | ICD-10-CM

## 2022-08-20 ENCOUNTER — Ambulatory Visit
Admission: RE | Admit: 2022-08-20 | Discharge: 2022-08-20 | Disposition: A | Discharge status: Home / Self Care | Payer: Medicare Other | Source: Ambulatory Visit | Attending: Family Medicine | Admitting: Family Medicine

## 2022-08-20 DIAGNOSIS — Z1231 Encounter for screening mammogram for malignant neoplasm of breast: Secondary | ICD-10-CM

## 2022-08-22 DIAGNOSIS — G4733 Obstructive sleep apnea (adult) (pediatric): Secondary | ICD-10-CM | POA: Diagnosis not present

## 2022-09-22 DIAGNOSIS — G4733 Obstructive sleep apnea (adult) (pediatric): Secondary | ICD-10-CM | POA: Diagnosis not present

## 2022-10-09 DIAGNOSIS — D2271 Melanocytic nevi of right lower limb, including hip: Secondary | ICD-10-CM | POA: Diagnosis not present

## 2022-10-09 DIAGNOSIS — D224 Melanocytic nevi of scalp and neck: Secondary | ICD-10-CM | POA: Diagnosis not present

## 2022-10-09 DIAGNOSIS — D2272 Melanocytic nevi of left lower limb, including hip: Secondary | ICD-10-CM | POA: Diagnosis not present

## 2022-10-09 DIAGNOSIS — Z86018 Personal history of other benign neoplasm: Secondary | ICD-10-CM | POA: Diagnosis not present

## 2022-10-14 DIAGNOSIS — G4733 Obstructive sleep apnea (adult) (pediatric): Secondary | ICD-10-CM | POA: Diagnosis not present

## 2022-10-22 DIAGNOSIS — G4733 Obstructive sleep apnea (adult) (pediatric): Secondary | ICD-10-CM | POA: Diagnosis not present

## 2022-10-23 DIAGNOSIS — K648 Other hemorrhoids: Secondary | ICD-10-CM | POA: Diagnosis not present

## 2022-10-23 DIAGNOSIS — Z1211 Encounter for screening for malignant neoplasm of colon: Secondary | ICD-10-CM | POA: Diagnosis not present

## 2022-10-23 DIAGNOSIS — K5730 Diverticulosis of large intestine without perforation or abscess without bleeding: Secondary | ICD-10-CM | POA: Diagnosis not present

## 2022-10-23 DIAGNOSIS — D120 Benign neoplasm of cecum: Secondary | ICD-10-CM | POA: Diagnosis not present

## 2022-10-25 DIAGNOSIS — D120 Benign neoplasm of cecum: Secondary | ICD-10-CM | POA: Diagnosis not present

## 2022-11-20 DIAGNOSIS — E039 Hypothyroidism, unspecified: Secondary | ICD-10-CM | POA: Diagnosis not present

## 2022-11-20 DIAGNOSIS — Z131 Encounter for screening for diabetes mellitus: Secondary | ICD-10-CM | POA: Diagnosis not present

## 2022-11-20 DIAGNOSIS — Z1330 Encounter for screening examination for mental health and behavioral disorders, unspecified: Secondary | ICD-10-CM | POA: Diagnosis not present

## 2022-11-20 DIAGNOSIS — M353 Polymyalgia rheumatica: Secondary | ICD-10-CM | POA: Diagnosis not present

## 2022-11-20 DIAGNOSIS — M1990 Unspecified osteoarthritis, unspecified site: Secondary | ICD-10-CM | POA: Diagnosis not present

## 2022-11-20 DIAGNOSIS — Z79899 Other long term (current) drug therapy: Secondary | ICD-10-CM | POA: Diagnosis not present

## 2022-11-20 DIAGNOSIS — Z1321 Encounter for screening for nutritional disorder: Secondary | ICD-10-CM | POA: Diagnosis not present

## 2022-11-20 DIAGNOSIS — Z0001 Encounter for general adult medical examination with abnormal findings: Secondary | ICD-10-CM | POA: Diagnosis not present

## 2022-12-05 DIAGNOSIS — H6993 Unspecified Eustachian tube disorder, bilateral: Secondary | ICD-10-CM | POA: Diagnosis not present

## 2022-12-05 DIAGNOSIS — H8113 Benign paroxysmal vertigo, bilateral: Secondary | ICD-10-CM | POA: Diagnosis not present

## 2022-12-12 DIAGNOSIS — K080 Exfoliation of teeth due to systemic causes: Secondary | ICD-10-CM | POA: Diagnosis not present

## 2022-12-24 DIAGNOSIS — E039 Hypothyroidism, unspecified: Secondary | ICD-10-CM | POA: Diagnosis not present

## 2022-12-24 DIAGNOSIS — G4733 Obstructive sleep apnea (adult) (pediatric): Secondary | ICD-10-CM | POA: Diagnosis not present

## 2022-12-24 DIAGNOSIS — Z6841 Body Mass Index (BMI) 40.0 and over, adult: Secondary | ICD-10-CM | POA: Diagnosis not present

## 2023-02-25 DIAGNOSIS — E785 Hyperlipidemia, unspecified: Secondary | ICD-10-CM | POA: Diagnosis not present

## 2023-02-25 DIAGNOSIS — E039 Hypothyroidism, unspecified: Secondary | ICD-10-CM | POA: Diagnosis not present

## 2023-02-25 DIAGNOSIS — R42 Dizziness and giddiness: Secondary | ICD-10-CM | POA: Diagnosis not present

## 2023-02-25 DIAGNOSIS — Z6841 Body Mass Index (BMI) 40.0 and over, adult: Secondary | ICD-10-CM | POA: Diagnosis not present

## 2023-02-25 DIAGNOSIS — G4733 Obstructive sleep apnea (adult) (pediatric): Secondary | ICD-10-CM | POA: Diagnosis not present

## 2023-03-28 DIAGNOSIS — M25562 Pain in left knee: Secondary | ICD-10-CM | POA: Diagnosis not present

## 2023-03-28 DIAGNOSIS — M79662 Pain in left lower leg: Secondary | ICD-10-CM | POA: Diagnosis not present

## 2023-03-28 DIAGNOSIS — M79604 Pain in right leg: Secondary | ICD-10-CM | POA: Diagnosis not present

## 2023-03-28 DIAGNOSIS — M79661 Pain in right lower leg: Secondary | ICD-10-CM | POA: Diagnosis not present

## 2023-03-28 DIAGNOSIS — M79605 Pain in left leg: Secondary | ICD-10-CM | POA: Diagnosis not present

## 2023-04-03 DIAGNOSIS — Z0000 Encounter for general adult medical examination without abnormal findings: Secondary | ICD-10-CM | POA: Diagnosis not present

## 2023-04-07 DIAGNOSIS — M25561 Pain in right knee: Secondary | ICD-10-CM | POA: Diagnosis not present

## 2023-04-07 DIAGNOSIS — M25562 Pain in left knee: Secondary | ICD-10-CM | POA: Diagnosis not present

## 2023-04-09 DIAGNOSIS — R599 Enlarged lymph nodes, unspecified: Secondary | ICD-10-CM | POA: Diagnosis not present

## 2023-04-09 DIAGNOSIS — D582 Other hemoglobinopathies: Secondary | ICD-10-CM | POA: Diagnosis not present

## 2023-04-28 ENCOUNTER — Other Ambulatory Visit: Payer: Self-pay | Admitting: Family Medicine

## 2023-04-28 DIAGNOSIS — R599 Enlarged lymph nodes, unspecified: Secondary | ICD-10-CM

## 2023-04-29 ENCOUNTER — Ambulatory Visit
Admission: RE | Admit: 2023-04-29 | Discharge: 2023-04-29 | Discharge status: Home / Self Care | Payer: Medicare Other | Source: Ambulatory Visit | Attending: Family Medicine | Admitting: Family Medicine

## 2023-04-29 DIAGNOSIS — R590 Localized enlarged lymph nodes: Secondary | ICD-10-CM | POA: Diagnosis not present

## 2023-04-29 DIAGNOSIS — Z6836 Body mass index (BMI) 36.0-36.9, adult: Secondary | ICD-10-CM | POA: Diagnosis not present

## 2023-04-29 DIAGNOSIS — R599 Enlarged lymph nodes, unspecified: Secondary | ICD-10-CM

## 2023-07-28 DIAGNOSIS — K080 Exfoliation of teeth due to systemic causes: Secondary | ICD-10-CM | POA: Diagnosis not present

## 2023-08-05 DIAGNOSIS — D582 Other hemoglobinopathies: Secondary | ICD-10-CM | POA: Diagnosis not present

## 2023-08-05 DIAGNOSIS — E039 Hypothyroidism, unspecified: Secondary | ICD-10-CM | POA: Diagnosis not present

## 2023-08-05 DIAGNOSIS — E785 Hyperlipidemia, unspecified: Secondary | ICD-10-CM | POA: Diagnosis not present

## 2023-08-05 DIAGNOSIS — Z0000 Encounter for general adult medical examination without abnormal findings: Secondary | ICD-10-CM | POA: Diagnosis not present

## 2023-08-23 DIAGNOSIS — E785 Hyperlipidemia, unspecified: Secondary | ICD-10-CM | POA: Diagnosis not present

## 2023-08-23 DIAGNOSIS — E039 Hypothyroidism, unspecified: Secondary | ICD-10-CM | POA: Diagnosis not present

## 2023-08-23 DIAGNOSIS — F329 Major depressive disorder, single episode, unspecified: Secondary | ICD-10-CM | POA: Diagnosis not present

## 2023-09-22 DIAGNOSIS — E039 Hypothyroidism, unspecified: Secondary | ICD-10-CM | POA: Diagnosis not present

## 2023-09-22 DIAGNOSIS — F329 Major depressive disorder, single episode, unspecified: Secondary | ICD-10-CM | POA: Diagnosis not present

## 2023-09-22 DIAGNOSIS — E785 Hyperlipidemia, unspecified: Secondary | ICD-10-CM | POA: Diagnosis not present

## 2023-09-25 ENCOUNTER — Other Ambulatory Visit: Payer: Self-pay | Admitting: Family Medicine

## 2023-09-25 DIAGNOSIS — Z1231 Encounter for screening mammogram for malignant neoplasm of breast: Secondary | ICD-10-CM

## 2023-10-06 ENCOUNTER — Ambulatory Visit

## 2023-10-13 ENCOUNTER — Ambulatory Visit
Admission: RE | Admit: 2023-10-13 | Discharge: 2023-10-13 | Disposition: A | Discharge status: Home / Self Care | Source: Ambulatory Visit | Attending: Family Medicine | Admitting: Family Medicine

## 2023-10-13 DIAGNOSIS — Z1231 Encounter for screening mammogram for malignant neoplasm of breast: Secondary | ICD-10-CM

## 2023-10-14 DIAGNOSIS — D224 Melanocytic nevi of scalp and neck: Secondary | ICD-10-CM | POA: Diagnosis not present

## 2023-10-14 DIAGNOSIS — R5383 Other fatigue: Secondary | ICD-10-CM | POA: Diagnosis not present

## 2023-10-14 DIAGNOSIS — D2271 Melanocytic nevi of right lower limb, including hip: Secondary | ICD-10-CM | POA: Diagnosis not present

## 2023-10-14 DIAGNOSIS — Z85820 Personal history of malignant melanoma of skin: Secondary | ICD-10-CM | POA: Diagnosis not present

## 2023-10-14 DIAGNOSIS — E669 Obesity, unspecified: Secondary | ICD-10-CM | POA: Diagnosis not present

## 2023-10-14 DIAGNOSIS — G4700 Insomnia, unspecified: Secondary | ICD-10-CM | POA: Diagnosis not present

## 2023-10-14 DIAGNOSIS — D485 Neoplasm of uncertain behavior of skin: Secondary | ICD-10-CM | POA: Diagnosis not present

## 2023-10-14 DIAGNOSIS — D2272 Melanocytic nevi of left lower limb, including hip: Secondary | ICD-10-CM | POA: Diagnosis not present

## 2023-10-14 DIAGNOSIS — G4733 Obstructive sleep apnea (adult) (pediatric): Secondary | ICD-10-CM | POA: Diagnosis not present

## 2023-10-14 DIAGNOSIS — Z86018 Personal history of other benign neoplasm: Secondary | ICD-10-CM | POA: Diagnosis not present

## 2023-10-21 DIAGNOSIS — R030 Elevated blood-pressure reading, without diagnosis of hypertension: Secondary | ICD-10-CM | POA: Diagnosis not present

## 2023-10-21 DIAGNOSIS — R42 Dizziness and giddiness: Secondary | ICD-10-CM | POA: Diagnosis not present

## 2023-10-21 DIAGNOSIS — K080 Exfoliation of teeth due to systemic causes: Secondary | ICD-10-CM | POA: Diagnosis not present

## 2023-10-22 DIAGNOSIS — G4733 Obstructive sleep apnea (adult) (pediatric): Secondary | ICD-10-CM | POA: Diagnosis not present

## 2023-10-23 DIAGNOSIS — E039 Hypothyroidism, unspecified: Secondary | ICD-10-CM | POA: Diagnosis not present

## 2023-10-23 DIAGNOSIS — E785 Hyperlipidemia, unspecified: Secondary | ICD-10-CM | POA: Diagnosis not present

## 2023-10-23 DIAGNOSIS — F329 Major depressive disorder, single episode, unspecified: Secondary | ICD-10-CM | POA: Diagnosis not present

## 2023-10-27 ENCOUNTER — Other Ambulatory Visit: Payer: Self-pay | Admitting: Family Medicine

## 2023-10-27 DIAGNOSIS — R42 Dizziness and giddiness: Secondary | ICD-10-CM

## 2023-10-27 DIAGNOSIS — D2371 Other benign neoplasm of skin of right lower limb, including hip: Secondary | ICD-10-CM | POA: Diagnosis not present

## 2023-10-27 DIAGNOSIS — D239 Other benign neoplasm of skin, unspecified: Secondary | ICD-10-CM | POA: Diagnosis not present

## 2023-11-03 DIAGNOSIS — H2513 Age-related nuclear cataract, bilateral: Secondary | ICD-10-CM | POA: Diagnosis not present

## 2023-11-03 DIAGNOSIS — H43813 Vitreous degeneration, bilateral: Secondary | ICD-10-CM | POA: Diagnosis not present

## 2023-11-05 ENCOUNTER — Ambulatory Visit
Admission: RE | Admit: 2023-11-05 | Discharge: 2023-11-05 | Disposition: A | Discharge status: Home / Self Care | Source: Ambulatory Visit | Attending: Family Medicine | Admitting: Family Medicine

## 2023-11-05 DIAGNOSIS — R42 Dizziness and giddiness: Secondary | ICD-10-CM | POA: Diagnosis not present

## 2023-11-05 MED ORDER — GADOPICLENOL 0.5 MMOL/ML IV SOLN
10.0000 mL | Freq: Once | INTRAVENOUS | Status: AC | PRN
  Administered 2023-11-05 (×2): 10 mL via INTRAVENOUS

## 2023-11-20 DIAGNOSIS — Z6837 Body mass index (BMI) 37.0-37.9, adult: Secondary | ICD-10-CM | POA: Diagnosis not present

## 2023-11-20 DIAGNOSIS — R030 Elevated blood-pressure reading, without diagnosis of hypertension: Secondary | ICD-10-CM | POA: Diagnosis not present

## 2023-11-20 DIAGNOSIS — R42 Dizziness and giddiness: Secondary | ICD-10-CM | POA: Diagnosis not present

## 2023-11-23 DIAGNOSIS — F329 Major depressive disorder, single episode, unspecified: Secondary | ICD-10-CM | POA: Diagnosis not present

## 2023-11-23 DIAGNOSIS — E785 Hyperlipidemia, unspecified: Secondary | ICD-10-CM | POA: Diagnosis not present

## 2023-11-23 DIAGNOSIS — E039 Hypothyroidism, unspecified: Secondary | ICD-10-CM | POA: Diagnosis not present

## 2023-12-22 DIAGNOSIS — M25561 Pain in right knee: Secondary | ICD-10-CM | POA: Diagnosis not present

## 2023-12-23 DIAGNOSIS — E039 Hypothyroidism, unspecified: Secondary | ICD-10-CM | POA: Diagnosis not present

## 2023-12-23 DIAGNOSIS — F329 Major depressive disorder, single episode, unspecified: Secondary | ICD-10-CM | POA: Diagnosis not present

## 2023-12-23 DIAGNOSIS — E785 Hyperlipidemia, unspecified: Secondary | ICD-10-CM | POA: Diagnosis not present

## 2024-01-23 DIAGNOSIS — E039 Hypothyroidism, unspecified: Secondary | ICD-10-CM | POA: Diagnosis not present

## 2024-01-23 DIAGNOSIS — E785 Hyperlipidemia, unspecified: Secondary | ICD-10-CM | POA: Diagnosis not present

## 2024-01-23 DIAGNOSIS — F329 Major depressive disorder, single episode, unspecified: Secondary | ICD-10-CM | POA: Diagnosis not present

## 2024-02-04 DIAGNOSIS — G4733 Obstructive sleep apnea (adult) (pediatric): Secondary | ICD-10-CM | POA: Diagnosis not present

## 2024-02-09 DIAGNOSIS — R030 Elevated blood-pressure reading, without diagnosis of hypertension: Secondary | ICD-10-CM | POA: Diagnosis not present

## 2024-02-09 DIAGNOSIS — E785 Hyperlipidemia, unspecified: Secondary | ICD-10-CM | POA: Diagnosis not present

## 2024-02-09 DIAGNOSIS — Z23 Encounter for immunization: Secondary | ICD-10-CM | POA: Diagnosis not present

## 2024-02-09 DIAGNOSIS — E559 Vitamin D deficiency, unspecified: Secondary | ICD-10-CM | POA: Diagnosis not present

## 2024-02-09 DIAGNOSIS — E039 Hypothyroidism, unspecified: Secondary | ICD-10-CM | POA: Diagnosis not present

## 2024-02-09 DIAGNOSIS — F329 Major depressive disorder, single episode, unspecified: Secondary | ICD-10-CM | POA: Diagnosis not present

## 2024-02-12 DIAGNOSIS — K080 Exfoliation of teeth due to systemic causes: Secondary | ICD-10-CM | POA: Diagnosis not present

## 2024-02-24 DIAGNOSIS — K080 Exfoliation of teeth due to systemic causes: Secondary | ICD-10-CM | POA: Diagnosis not present

## 2024-03-02 DIAGNOSIS — K080 Exfoliation of teeth due to systemic causes: Secondary | ICD-10-CM | POA: Diagnosis not present
